# Patient Record
Sex: Male | Born: 1980 | Race: White | Hispanic: No | Marital: Married | Smoking: Current every day smoker
Health system: Southern US, Community
[De-identification: ages and names within clinical notes are randomized; demographics above are authoritative.]

## PROBLEM LIST (undated history)

## (undated) DIAGNOSIS — M069 Rheumatoid arthritis, unspecified: Secondary | ICD-10-CM

## (undated) HISTORY — DX: Rheumatoid arthritis, unspecified: M06.9

---

## 2000-08-02 ENCOUNTER — Emergency Department (HOSPITAL_COMMUNITY): Admission: EM | Admit: 2000-08-02 | Discharge: 2000-08-02 | Payer: Self-pay | Admitting: Emergency Medicine

## 2001-06-05 ENCOUNTER — Emergency Department (HOSPITAL_COMMUNITY): Admission: EM | Admit: 2001-06-05 | Discharge: 2001-06-05 | Payer: Self-pay | Admitting: Emergency Medicine

## 2004-07-25 ENCOUNTER — Emergency Department: Payer: Self-pay | Admitting: Emergency Medicine

## 2009-12-24 ENCOUNTER — Emergency Department: Payer: Self-pay | Admitting: Emergency Medicine

## 2010-01-22 ENCOUNTER — Emergency Department: Payer: Self-pay | Admitting: Emergency Medicine

## 2011-09-16 ENCOUNTER — Ambulatory Visit: Payer: Self-pay | Admitting: Family Medicine

## 2019-05-30 ENCOUNTER — Emergency Department: Payer: PRIVATE HEALTH INSURANCE

## 2019-05-30 ENCOUNTER — Inpatient Hospital Stay
Admission: EM | Admit: 2019-05-30 | Discharge: 2019-06-02 | DRG: 419 | Disposition: A | Discharge status: Home / Self Care | Payer: PRIVATE HEALTH INSURANCE | Attending: Surgery | Admitting: Surgery

## 2019-05-30 ENCOUNTER — Other Ambulatory Visit: Payer: Self-pay

## 2019-05-30 DIAGNOSIS — K819 Cholecystitis, unspecified: Secondary | ICD-10-CM | POA: Diagnosis not present

## 2019-05-30 DIAGNOSIS — K8001 Calculus of gallbladder with acute cholecystitis with obstruction: Secondary | ICD-10-CM | POA: Diagnosis not present

## 2019-05-30 DIAGNOSIS — R1011 Right upper quadrant pain: Secondary | ICD-10-CM

## 2019-05-30 DIAGNOSIS — K8000 Calculus of gallbladder with acute cholecystitis without obstruction: Secondary | ICD-10-CM | POA: Diagnosis present

## 2019-05-30 DIAGNOSIS — K429 Umbilical hernia without obstruction or gangrene: Secondary | ICD-10-CM

## 2019-05-30 DIAGNOSIS — R0902 Hypoxemia: Secondary | ICD-10-CM | POA: Diagnosis not present

## 2019-05-30 DIAGNOSIS — Z20822 Contact with and (suspected) exposure to covid-19: Secondary | ICD-10-CM | POA: Diagnosis present

## 2019-05-30 DIAGNOSIS — E876 Hypokalemia: Secondary | ICD-10-CM | POA: Diagnosis not present

## 2019-05-30 LAB — CBC
HCT: 38.4 % — ABNORMAL LOW (ref 39.0–52.0)
Hemoglobin: 13.2 g/dL (ref 13.0–17.0)
MCH: 27.5 pg (ref 26.0–34.0)
MCHC: 34.4 g/dL (ref 30.0–36.0)
MCV: 80 fL (ref 80.0–100.0)
Platelets: 203 10*3/uL (ref 150–400)
RBC: 4.8 MIL/uL (ref 4.22–5.81)
RDW: 13.1 % (ref 11.5–15.5)
WBC: 9 10*3/uL (ref 4.0–10.5)
nRBC: 0 % (ref 0.0–0.2)

## 2019-05-30 LAB — BASIC METABOLIC PANEL
Anion gap: 10 (ref 5–15)
BUN: 12 mg/dL (ref 6–20)
CO2: 28 mmol/L (ref 22–32)
Calcium: 9.6 mg/dL (ref 8.9–10.3)
Chloride: 101 mmol/L (ref 98–111)
Creatinine, Ser: 1.07 mg/dL (ref 0.61–1.24)
GFR calc Af Amer: >60 mL/min (ref >60)
GFR calc non Af Amer: >60 mL/min (ref >60)
Glucose, Bld: 120 mg/dL — ABNORMAL HIGH (ref 70–99)
Potassium: 3.9 mmol/L (ref 3.5–5.1)
Sodium: 139 mmol/L (ref 135–145)

## 2019-05-30 LAB — HEPATIC FUNCTION PANEL
ALT: 80 U/L — ABNORMAL HIGH (ref 0–44)
AST: 60 U/L — ABNORMAL HIGH (ref 15–41)
Albumin: 4.4 g/dL (ref 3.5–5.0)
Alkaline Phosphatase: 70 U/L (ref 38–126)
Bilirubin, Direct: <0.1 mg/dL (ref 0.0–0.2)
Total Bilirubin: 0.8 mg/dL (ref 0.3–1.2)
Total Protein: 7.7 g/dL (ref 6.5–8.1)

## 2019-05-30 LAB — SARS CORONAVIRUS 2 BY RT PCR (HOSPITAL ORDER, PERFORMED IN ~~LOC~~ HOSPITAL LAB): SARS Coronavirus 2: NEGATIVE

## 2019-05-30 LAB — TROPONIN I (HIGH SENSITIVITY): Troponin I (High Sensitivity): 3 ng/L (ref <18)

## 2019-05-30 LAB — LIPASE, BLOOD: Lipase: 21 U/L (ref 11–51)

## 2019-05-30 MED ORDER — HYDROMORPHONE HCL 1 MG/ML IJ SOLN
INTRAMUSCULAR | Status: AC
  Administered 2019-05-30: 0.5 mg via INTRAVENOUS
  Filled 2019-05-30: qty 1

## 2019-05-30 MED ORDER — DEXTROSE IN LACTATED RINGERS 5 % IV SOLN: INTRAVENOUS | Status: DC

## 2019-05-30 MED ORDER — HYDROMORPHONE HCL 1 MG/ML IJ SOLN
0.5000 mg | Freq: Once | INTRAMUSCULAR | Status: AC
  Administered 2019-05-30: 0.5 mg via INTRAVENOUS
  Filled 2019-05-30: qty 1

## 2019-05-30 MED ORDER — HYDROMORPHONE HCL 1 MG/ML IJ SOLN
0.5000 mg | INTRAMUSCULAR | Status: DC | PRN
  Administered 2019-06-01: 0.5 mg via INTRAVENOUS
  Filled 2019-05-30: qty 1

## 2019-05-30 MED ORDER — IOHEXOL 300 MG/ML  SOLN
125.0000 mL | Freq: Once | INTRAMUSCULAR | Status: AC | PRN
  Administered 2019-05-30: 125 mL via INTRAVENOUS
  Filled 2019-05-30: qty 125

## 2019-05-30 MED ORDER — PIPERACILLIN-TAZOBACTAM 3.375 G IVPB
3.3750 g | Freq: Three times a day (TID) | INTRAVENOUS | Status: DC
  Administered 2019-05-31 – 2019-06-02 (×8): 3.375 g via INTRAVENOUS
  Filled 2019-05-30 (×10): qty 50

## 2019-05-30 MED ORDER — ONDANSETRON HCL 4 MG/2ML IJ SOLN
4.0000 mg | Freq: Four times a day (QID) | INTRAMUSCULAR | Status: DC | PRN
  Administered 2019-05-30 (×2): 4 mg via INTRAVENOUS
  Filled 2019-05-30: qty 2

## 2019-05-30 MED ORDER — PIPERACILLIN-TAZOBACTAM 3.375 G IVPB 30 MIN
3.3750 g | Freq: Once | INTRAVENOUS | Status: AC
  Administered 2019-05-30: 3.375 g via INTRAVENOUS
  Filled 2019-05-30: qty 50

## 2019-05-30 MED ORDER — KETOROLAC TROMETHAMINE 30 MG/ML IJ SOLN
INTRAMUSCULAR | Status: AC
  Administered 2019-05-30: 30 mg via INTRAVENOUS
  Filled 2019-05-30: qty 1

## 2019-05-30 MED ORDER — MORPHINE SULFATE (PF) 4 MG/ML IV SOLN
4.0000 mg | Freq: Once | INTRAVENOUS | Status: AC
  Administered 2019-05-30: 4 mg via INTRAVENOUS
  Filled 2019-05-30: qty 1

## 2019-05-30 MED ORDER — ACETAMINOPHEN 500 MG PO TABS
ORAL_TABLET | ORAL | Status: AC
  Filled 2019-05-30: qty 2

## 2019-05-30 MED ORDER — ONDANSETRON HCL 4 MG/2ML IJ SOLN
4.0000 mg | Freq: Once | INTRAMUSCULAR | Status: AC
  Administered 2019-05-30: 4 mg via INTRAVENOUS
  Filled 2019-05-30: qty 2

## 2019-05-30 MED ORDER — SODIUM CHLORIDE 0.9% FLUSH: 3.0000 mL | Freq: Once | INTRAVENOUS | Status: DC

## 2019-05-30 MED ORDER — LACTATED RINGERS IV BOLUS
1000.0000 mL | Freq: Once | INTRAVENOUS | Status: AC
  Administered 2019-05-30: 1000 mL via INTRAVENOUS

## 2019-05-30 MED ORDER — ONDANSETRON 4 MG PO TBDP
4.0000 mg | ORAL_TABLET | Freq: Four times a day (QID) | ORAL | Status: DC | PRN
  Filled 2019-05-30: qty 1

## 2019-05-30 MED ORDER — SIMETHICONE 80 MG PO CHEW
40.0000 mg | CHEWABLE_TABLET | Freq: Four times a day (QID) | ORAL | Status: DC | PRN
  Filled 2019-05-30: qty 1

## 2019-05-30 MED ORDER — KETOROLAC TROMETHAMINE 30 MG/ML IJ SOLN
30.0000 mg | Freq: Four times a day (QID) | INTRAMUSCULAR | Status: DC | PRN
  Administered 2019-05-31 – 2019-06-02 (×2): 30 mg via INTRAVENOUS
  Filled 2019-05-30 (×2): qty 1

## 2019-05-30 MED ORDER — ACETAMINOPHEN 500 MG PO TABS
1000.0000 mg | ORAL_TABLET | Freq: Four times a day (QID) | ORAL | Status: DC
  Administered 2019-05-30 – 2019-06-02 (×7): 1000 mg via ORAL
  Filled 2019-05-30 (×7): qty 2

## 2019-05-30 NOTE — ED Notes (Signed)
See triage note-  Pt states epigastric pain started this morning, took pepcid and tums without relief, pain has since radiated down right and left abd that is aching and sharp, nausea without vomiting.  Pt appears uncomfortable in treatment room.

## 2019-05-30 NOTE — ED Notes (Signed)
Pt rolling around the bed reporting the pain is unbearable at this time. Pt unable to get comfortable and reports nausea has returned as well.

## 2019-05-30 NOTE — H&P (Signed)
Reason for Consult: Acute cholecystitis Referring Physician: Blake Divine, MD (emergency medicine)  Raymond Marsh is an 39 y.o. male.  HPI: He presented to the emergency department today with epigastric pain that began at about 4 AM.  He says that he thought it was heartburn and took some Pepcid.  He then went back to sleep but when he awoke, he was still uncomfortable.  He went to work, but became increasingly uncomfortable and presented to the emergency department.  His pain is predominantly in the epigastrium and right upper quadrant, with some radiation to the right lower quadrant.  He has had nausea without vomiting.  He has never had a similar episode.  Evaluation in the emergency department included a right upper quadrant ultrasound that was consistent with acute calculus cholecystitis.  There was no concern for choledocholithiasis and his white blood cell count is actually  normal.  General surgery has been consulted for further evaluation and management.  He has never had abdominal surgery.  History reviewed. No pertinent past medical history.  History reviewed. No pertinent surgical history.  History reviewed. No pertinent family history.  Social History:  has no history on file for tobacco, alcohol, and drug.  Allergies: Not on File  Medications: I have reviewed the patient's current medications, which are none  Results for orders placed or performed during the hospital encounter of 05/30/19 (from the past 48 hour(s))  Basic metabolic panel     Status: Abnormal   Collection Time: 05/30/19 11:40 AM  Result Value Ref Range   Sodium 139 135 - 145 mmol/L   Potassium 3.9 3.5 - 5.1 mmol/L   Chloride 101 98 - 111 mmol/L   CO2 28 22 - 32 mmol/L   Glucose, Bld 120 (H) 70 - 99 mg/dL    Comment: Glucose reference range applies only to samples taken after fasting for at least 8 hours.   BUN 12 6 - 20 mg/dL   Creatinine, Ser 1.07 0.61 - 1.24 mg/dL   Calcium 9.6 8.9 - 10.3 mg/dL   GFR  calc non Af Amer >60 >60 mL/min   GFR calc Af Amer >60 >60 mL/min   Anion gap 10 5 - 15    Comment: Performed at [facility], [address] [place], [place] [address]  CBC     Status: Abnormal   Collection Time: 05/30/19 11:40 AM  Result Value Ref Range   WBC 9.0 4.0 - 10.5 K/uL   RBC 4.80 4.22 - 5.81 MIL/uL   Hemoglobin 13.2 13.0 - 17.0 g/dL   HCT 38.4 (L) 39.0 - 52.0 %   MCV 80.0 80.0 - 100.0 fL   MCH 27.5 26.0 - 34.0 pg   MCHC 34.4 30.0 - 36.0 g/dL   RDW 13.1 11.5 - 15.5 %   Platelets 203 150 - 400 K/uL   nRBC 0.0 0.0 - 0.2 %    Comment: Performed at [facility], [place],  [address]  Troponin I (High Sensitivity)     Status: None   Collection Time: 05/30/19 11:40 AM  Result Value Ref Range   Troponin I (High Sensitivity) 3 <18 ng/L    Comment: (NOTE) Elevated high sensitivity troponin I (hsTnI) values and significant  changes across serial measurements may suggest ACS but many other  chronic and acute conditions are known to elevate hsTnI results.  Refer to the "Links" section for chest pain algorithms and additional  guidance. Performed at [facility], [address] [place], [place] [address]   Hepatic function panel     Status: Abnormal   Collection Time: 05/30/19 11:40 AM  Result Value Ref Range   Total Protein 7.7 6.5 - 8.1 g/dL   Albumin 4.4 3.5 - 5.0 g/dL   AST 60 (H) 15 - 41 U/L   ALT 80 (H) 0 - 44 U/L   Alkaline Phosphatase 70 38 - 126 U/L   Total Bilirubin 0.8 0.3 - 1.2 mg/dL   Bilirubin, Direct <7.4 0.0 - 0.2 mg/dL   Indirect Bilirubin NOT CALCULATED 0.3 - 0.9 mg/dL    Comment: Performed at [facility], [address] [place], [place] [address]  Lipase, blood     Status: None   Collection Time: 05/30/19 11:40 AM  Result Value Ref Range   Lipase 21 11 - 51 U/L    Comment: Performed at [facility], [address], [place], [place] [address]    DG Chest 2 View  Result Date:  05/30/2019 CLINICAL DATA:  Chest pain. EXAM: CHEST - 2 VIEW COMPARISON:  September 16, 2011. FINDINGS: The heart size and mediastinal contours are within normal limits. Both lungs are clear. No pneumothorax or pleural effusion is noted. The visualized skeletal structures are unremarkable. IMPRESSION: No active cardiopulmonary disease. Electronically Signed   By: Lupita Raider M.D.   On: 05/30/2019 12:41   CT Abdomen Pelvis W Contrast  Result Date: 05/30/2019 CLINICAL DATA:  Epigastric pain beginning this morning.  Nausea. EXAM: CT ABDOMEN AND PELVIS WITH CONTRAST TECHNIQUE: Multidetector CT imaging of the abdomen and pelvis was performed using the standard protocol following bolus administration of intravenous contrast. CONTRAST:  OMNIPAQUE IOHEXOL 300 MG/ML  SOLN COMPARISON:  None. FINDINGS: Lower chest: Lung bases are clear. Hepatobiliary: Mild diffuse low-attenuation of the liver likely representing a degree of steatosis. No focal liver mass. Gallbladder is minimally distended without wall thickening or stones visualized. Possible subtle inflammatory change adjacent the gallbladder neck. Biliary tree is normal. Pancreas: Mild fatty atrophy. Spleen: Normal. Adrenals/Urinary Tract: Adrenal glands are normal and symmetric. Kidneys are normal in size without hydronephrosis or nephrolithiasis. Ureters and bladder are normal. Stomach/Bowel: Stomach and small bowel are normal. Appendix is normal. Colon is normal. Vascular/Lymphatic: Abdominal aorta is normal in caliber. No adenopathy. Reproductive: Normal. Other: No free fluid or focal inflammatory change. Small umbilical hernia containing only peritoneal fat. Musculoskeletal: No acute findings. IMPRESSION: 1.  No acute findings in the abdomen/pelvis. 2. Minimal nonspecific prominence of the gallbladder with possible subtle inflammatory change adjacent the gallbladder neck. Consider right upper quadrant ultrasound for further evaluation. 3.  Possible mild  degree of hepatic steatosis without focal mass. 4.  Small umbilical hernia containing only peritoneal fat. Electronically Signed   By: Elberta Fortis M.D.   On: 05/30/2019 15:57   US Abdomen Limited RUQ  Result Date: 05/30/2019 CLINICAL DATA:  Right upper quadrant pain and nausea. EXAM: ULTRASOUND ABDOMEN LIMITED RIGHT UPPER QUADRANT COMPARISON:  None. FINDINGS: Gallbladder: Ill-defined mobile echogenic renal stones are seen (largest measures 6.2 mm). Mild gallbladder wall thickening is seen (3.0 mm). A positive sonographic Eulah Pont sign is noted by sonographer. Common bile duct: Diameter: 5.3 mm Liver: No focal lesion identified. Diffusely increased echogenicity of the liver parenchyma is noted. Portal vein is patent on color Doppler imaging with normal direction of blood flow towards the liver. Other: None. IMPRESSION: 1. Cholelithiasis with additional findings consistent with acute cholecystitis. 2. Fatty liver. Electronically Signed   By: Waylan Rocher  Houston M.D.   On: 05/30/2019 17:01    Review of Systems  Gastrointestinal: Positive for abdominal pain and nausea.   Blood pressure (!) 158/84, pulse 94, temperature 98.7 F (37.1 C), temperature source Oral, resp. rate 20, height 6\' 2"  (1.88 m), weight 111.1 kg, SpO2 94 %.  Body mass index is 31.46 kg/m.  Physical Exam  Constitutional: He is oriented to person, place, and time. He appears well-developed and well-nourished.  He is clearly uncomfortable.  HENT:  Head: Normocephalic and atraumatic.  Eyes: Right eye exhibits no discharge. Left eye exhibits no discharge. No scleral icterus.  Neck: No tracheal deviation present.  Cardiovascular: Normal rate and regular rhythm.  Respiratory: Effort normal. No stridor. No respiratory distress.  GI: Soft. There is abdominal tenderness.  Primarily tender to deep palpation in the epigastrium and right upper quadrant, although he does have mild tenderness in the right lower quadrant as well.   Genitourinary:    Genitourinary Comments: Deferred   Musculoskeletal:        General: No deformity or edema.  Neurological: He is alert and oriented to person, place, and time.  Skin: Skin is warm and dry.  Psychiatric: He has a normal mood and affect. His behavior is normal.    Assessment/Plan: This is a 39 year old man with acute onset of epigastric and right upper quadrant pain beginning at 4 AM.  Evaluation in the emergency department is most consistent with acute cholecystitis.  We will admit him and give him IV fluids and IV antibiotics.  He may have clear liquids tonight and will be made n.p.o. after midnight in anticipation of cholecystectomy tomorrow, pending OR and anesthesia availability. I discussed the procedure in detail.  We discussed the risks and benefits of a laparoscopic cholecystectomy and possible cholangiogram including, but not limited to: bleeding, infection, injury to surrounding structures such as the intestine or liver, bile leak, retained gallstones, need to convert to an open procedure, prolonged diarrhea, blood clots such as DVT, common bile duct injury, anesthesia risks, and possible need for additional procedures. The patient had the opportunity to ask any questions and these were answered to his satisfaction. 20 05/30/2019, 6:59 PM

## 2019-05-30 NOTE — ED Provider Notes (Signed)
[facility] Emergency Department Provider Note   ____________________________________________   First MD Initiated Contact with Patient 05/30/19 1441     (approximate)  I have reviewed the triage vital signs and the nursing notes.   HISTORY  Chief Complaint Chest Pain    HPI Raymond Marsh is a 39 y.o. male with no significant past medical history who presents to the ED complaining of chest and abdominal pain.  Patient reports he initially developed pain in his epigastrium around 4 AM this morning which has gradually worsened since then.  He took Pepcid and Tums without any relief and pain has now seem to move into his abdomen diffusely.  He also states that it radiates to his mid back on both sides, but currently denies any pain in his chest or difficulty breathing.  He has not had any fevers, chills, or cough recently.  He denies any dysuria, hematuria, or changes in bowel movements.  He denies any prior abdominal surgeries and has never had similar pain in the past.        History reviewed. No pertinent past medical history.  Patient Active Problem List   Diagnosis Date Noted  . Acute calculous cholecystitis 05/30/2019    History reviewed. No pertinent surgical history.  Prior to Admission medications   Not on File    Allergies Patient has no allergy information on record.  History reviewed. No pertinent family history.  Social History Social History   Tobacco Use  . Smoking status: Not on file  Substance Use Topics  . Alcohol use: Not on file  . Drug use: Not on file    Review of Systems  Constitutional: No fever/chills Eyes: No visual changes. ENT: No sore throat. Cardiovascular: Denies chest pain. Respiratory: Denies shortness of breath. Gastrointestinal: Positive for abdominal pain.  Positive for nausea, no vomiting.  No diarrhea.  No constipation. Genitourinary: Negative for dysuria. Musculoskeletal: Negative for back  pain. Skin: Negative for rash. Neurological: Negative for headaches, focal weakness or numbness.  ____________________________________________   PHYSICAL EXAM:  VITAL SIGNS: ED Triage Vitals  Enc Vitals Group     BP 05/30/19 1139 (!) 151/83     Pulse Rate 05/30/19 1139 98     Resp 05/30/19 1139 18     Temp 05/30/19 1139 98.7 F (37.1 C)     Temp Source 05/30/19 1139 Oral     SpO2 05/30/19 1139 99 %     Weight 05/30/19 1139 245 lb (111.1 kg)     Height 05/30/19 1139 6\' 2"  (1.88 m)     Head Circumference --      Peak Flow --      Pain Score 05/30/19 1152 8     Pain Loc --      Pain Edu? --      Excl. in GC? --     Constitutional: Alert and oriented. Eyes: Conjunctivae are normal. Head: Atraumatic. Nose: No congestion/rhinnorhea. Mouth/Throat: Mucous membranes are moist. Neck: Normal ROM Cardiovascular: Normal rate, regular rhythm. Grossly normal heart sounds. Respiratory: Normal respiratory effort.  No retractions. Lungs CTAB. Gastrointestinal: Soft and tender to palpation in the right upper quadrant and right lower quadrant with voluntary guarding. No distention. Genitourinary: deferred Musculoskeletal: No lower extremity tenderness nor edema. Neurologic:  Normal speech and language. No gross focal neurologic deficits are appreciated. Skin:  Skin is warm, dry and intact. No rash noted. Psychiatric: Mood and affect are normal. Speech and behavior are normal.  ____________________________________________  LABS (all labs ordered are listed, but only abnormal results are displayed)  Labs Reviewed  BASIC METABOLIC PANEL - Abnormal; Notable for the following components:      Result Value   Glucose, Bld 120 (*)    All other components within normal limits  CBC - Abnormal; Notable for the following components:   HCT 38.4 (*)    All other components within normal limits  HEPATIC FUNCTION PANEL - Abnormal; Notable for the following components:   AST 60 (*)    ALT 80  (*)    All other components within normal limits  SARS CORONAVIRUS 2 BY RT PCR (HOSPITAL ORDER, [facility])  LIPASE, BLOOD  HIV ANTIBODY (ROUTINE TESTING W REFLEX)  CBC  BASIC METABOLIC PANEL  TROPONIN I (HIGH SENSITIVITY)  TROPONIN I (HIGH SENSITIVITY)   ____________________________________________  EKG  ED ECG REPORT I, Blake Divine, the attending physician, personally viewed and interpreted this ECG.   Date: 05/30/2019  EKG Time: 11:33  Rate: 89  Rhythm: normal sinus rhythm  Axis: Normal  Intervals:none  ST&T Change: None   PROCEDURES  Procedure(s) performed (including Critical Care):  Procedures   ____________________________________________   INITIAL IMPRESSION / ASSESSMENT AND PLAN / ED COURSE       39 year old male presents to the ED with acute onset of pain in his epigastrium around 4 AM this morning which has gradually worsened and spread into his abdomen more diffusely.  Initial work-up for chest pain is unremarkable, EKG without acute ischemic changes and troponin is negative.  Chest x-ray is also negative for acute process.  At this point, I would favor intra-abdominal pathology rather than a cardiac or pulmonary issue.  We will add on LFTs and lipase, further assess with CT scan to check for cholecystitis, appendicitis, or other pathology.  Plan to treat pain with IV morphine and Zofran, hydrate with IV fluids.  Dissection seems unlikely given his young age and lack of risk factors, pulses are intact in all 4 extremities.  CT abdomen/pelvis shows subtle inflammatory changes near the neck of the gallbladder bed is otherwise negative for acute process.  Patient continues to have severe right upper quadrant pain, we will medicate with IV Dilaudid.  Right upper quadrant ultrasound was performed and is consistent with cholecystitis.  General surgery was consulted and will plan for admission.       ____________________________________________   FINAL CLINICAL IMPRESSION(S) / ED DIAGNOSES  Final diagnoses:  RUQ pain     ED Discharge Orders    None       Note:  This document was prepared using Dragon voice recognition software and may include unintentional dictation errors.   Blake Divine, MD 05/30/19 1859

## 2019-05-30 NOTE — ED Triage Notes (Addendum)
Pt arrives via POV for c/o chest pain that started at 4am. Pain is in upper abdomen/chest area and pt reports pain radiates to both sides of his back under neath his arms. Pt took pepcid but did not get any relief. Pt appears uncomfortable in triage. Denies SHOB. Pt reports he has an umbilical hernia and has had it "for a long time".

## 2019-05-31 ENCOUNTER — Observation Stay: Payer: PRIVATE HEALTH INSURANCE | Admitting: Anesthesiology

## 2019-05-31 ENCOUNTER — Encounter
Admission: EM | Disposition: A | Discharge status: Home / Self Care | Payer: Self-pay | Source: Home / Self Care | Attending: General Surgery

## 2019-05-31 ENCOUNTER — Other Ambulatory Visit: Payer: Self-pay

## 2019-05-31 ENCOUNTER — Encounter: Payer: Self-pay | Admitting: General Surgery

## 2019-05-31 DIAGNOSIS — R0902 Hypoxemia: Secondary | ICD-10-CM | POA: Diagnosis not present

## 2019-05-31 DIAGNOSIS — K8001 Calculus of gallbladder with acute cholecystitis with obstruction: Secondary | ICD-10-CM | POA: Diagnosis present

## 2019-05-31 DIAGNOSIS — Z20822 Contact with and (suspected) exposure to covid-19: Secondary | ICD-10-CM | POA: Diagnosis present

## 2019-05-31 DIAGNOSIS — K819 Cholecystitis, unspecified: Secondary | ICD-10-CM | POA: Diagnosis present

## 2019-05-31 DIAGNOSIS — K8000 Calculus of gallbladder with acute cholecystitis without obstruction: Secondary | ICD-10-CM | POA: Diagnosis not present

## 2019-05-31 DIAGNOSIS — K429 Umbilical hernia without obstruction or gangrene: Secondary | ICD-10-CM | POA: Diagnosis present

## 2019-05-31 DIAGNOSIS — E876 Hypokalemia: Secondary | ICD-10-CM | POA: Diagnosis not present

## 2019-05-31 HISTORY — PX: UMBILICAL HERNIA REPAIR: SHX196

## 2019-05-31 HISTORY — PX: CHOLECYSTECTOMY: SHX55

## 2019-05-31 LAB — CBC
HCT: 37.1 % — ABNORMAL LOW (ref 39.0–52.0)
Hemoglobin: 13.2 g/dL (ref 13.0–17.0)
MCH: 27.8 pg (ref 26.0–34.0)
MCHC: 35.6 g/dL (ref 30.0–36.0)
MCV: 78.1 fL — ABNORMAL LOW (ref 80.0–100.0)
Platelets: 186 10*3/uL (ref 150–400)
RBC: 4.75 MIL/uL (ref 4.22–5.81)
RDW: 13.2 % (ref 11.5–15.5)
WBC: 12.4 10*3/uL — ABNORMAL HIGH (ref 4.0–10.5)
nRBC: 0 % (ref 0.0–0.2)

## 2019-05-31 LAB — BASIC METABOLIC PANEL
Anion gap: 9 (ref 5–15)
BUN: 14 mg/dL (ref 6–20)
CO2: 28 mmol/L (ref 22–32)
Calcium: 9.1 mg/dL (ref 8.9–10.3)
Chloride: 100 mmol/L (ref 98–111)
Creatinine, Ser: 1.17 mg/dL (ref 0.61–1.24)
GFR calc Af Amer: >60 mL/min (ref >60)
GFR calc non Af Amer: >60 mL/min (ref >60)
Glucose, Bld: 139 mg/dL — ABNORMAL HIGH (ref 70–99)
Potassium: 3.4 mmol/L — ABNORMAL LOW (ref 3.5–5.1)
Sodium: 137 mmol/L (ref 135–145)

## 2019-05-31 LAB — HIV ANTIBODY (ROUTINE TESTING W REFLEX): HIV Screen 4th Generation wRfx: NONREACTIVE

## 2019-05-31 SURGERY — LAPAROSCOPIC CHOLECYSTECTOMY
Anesthesia: General

## 2019-05-31 MED ORDER — GLYCOPYRROLATE 0.2 MG/ML IJ SOLN
INTRAMUSCULAR | Status: AC
  Filled 2019-05-31: qty 1

## 2019-05-31 MED ORDER — VISTASEAL 10 ML SINGLE DOSE KIT
PACK | CUTANEOUS | Status: DC | PRN
  Administered 2019-05-31: 10 mL via TOPICAL

## 2019-05-31 MED ORDER — MIDAZOLAM HCL 2 MG/2ML IJ SOLN
INTRAMUSCULAR | Status: AC
  Filled 2019-05-31: qty 2

## 2019-05-31 MED ORDER — SEVOFLURANE IN SOLN
RESPIRATORY_TRACT | Status: AC
  Filled 2019-05-31: qty 250

## 2019-05-31 MED ORDER — ACETAMINOPHEN 325 MG PO TABS: 325.0000 mg | ORAL_TABLET | ORAL | Status: DC | PRN

## 2019-05-31 MED ORDER — DEXAMETHASONE SODIUM PHOSPHATE 10 MG/ML IJ SOLN
INTRAMUSCULAR | Status: AC
  Filled 2019-05-31: qty 1

## 2019-05-31 MED ORDER — PROPOFOL 10 MG/ML IV BOLUS
INTRAVENOUS | Status: AC
  Filled 2019-05-31: qty 20

## 2019-05-31 MED ORDER — KCL-LACTATED RINGERS-D5W 20 MEQ/L IV SOLN
INTRAVENOUS | Status: DC
  Filled 2019-05-31 (×6): qty 1000

## 2019-05-31 MED ORDER — LIDOCAINE-EPINEPHRINE 1 %-1:100000 IJ SOLN
INTRAMUSCULAR | Status: AC
  Filled 2019-05-31: qty 1

## 2019-05-31 MED ORDER — LACTATED RINGERS IV SOLN: INTRAVENOUS | Status: DC

## 2019-05-31 MED ORDER — PHENYLEPHRINE HCL (PRESSORS) 10 MG/ML IV SOLN
INTRAVENOUS | Status: DC | PRN
  Administered 2019-05-31: 100 ug via INTRAVENOUS
  Administered 2019-05-31 (×4): 200 ug via INTRAVENOUS
  Administered 2019-05-31 (×2): 100 ug via INTRAVENOUS
  Administered 2019-05-31: 200 ug via INTRAVENOUS

## 2019-05-31 MED ORDER — ACETAMINOPHEN 10 MG/ML IV SOLN
INTRAVENOUS | Status: DC | PRN
  Administered 2019-05-31: 1000 mg via INTRAVENOUS

## 2019-05-31 MED ORDER — FENTANYL CITRATE (PF) 100 MCG/2ML IJ SOLN
INTRAMUSCULAR | Status: AC
  Filled 2019-05-31: qty 2

## 2019-05-31 MED ORDER — PIPERACILLIN-TAZOBACTAM 3.375 G IVPB
INTRAVENOUS | Status: AC
  Filled 2019-05-31: qty 50

## 2019-05-31 MED ORDER — ONDANSETRON HCL 4 MG/2ML IJ SOLN
INTRAMUSCULAR | Status: AC
  Filled 2019-05-31: qty 2

## 2019-05-31 MED ORDER — LIDOCAINE HCL (CARDIAC) PF 100 MG/5ML IV SOSY
PREFILLED_SYRINGE | INTRAVENOUS | Status: DC | PRN
  Administered 2019-05-31: 100 mg via INTRAVENOUS

## 2019-05-31 MED ORDER — VISTASEAL 10 ML SINGLE DOSE KIT
PACK | CUTANEOUS | Status: AC
  Filled 2019-05-31: qty 10

## 2019-05-31 MED ORDER — PROPOFOL 10 MG/ML IV BOLUS
INTRAVENOUS | Status: DC | PRN
  Administered 2019-05-31: 250 mg via INTRAVENOUS

## 2019-05-31 MED ORDER — DEXAMETHASONE SODIUM PHOSPHATE 10 MG/ML IJ SOLN
INTRAMUSCULAR | Status: DC | PRN
  Administered 2019-05-31: 10 mg via INTRAVENOUS

## 2019-05-31 MED ORDER — SUGAMMADEX SODIUM 500 MG/5ML IV SOLN
INTRAVENOUS | Status: AC
  Filled 2019-05-31: qty 5

## 2019-05-31 MED ORDER — FENTANYL CITRATE (PF) 100 MCG/2ML IJ SOLN
25.0000 ug | INTRAMUSCULAR | Status: DC | PRN
  Administered 2019-05-31 (×3): 25 ug via INTRAVENOUS

## 2019-05-31 MED ORDER — SUGAMMADEX SODIUM 200 MG/2ML IV SOLN
INTRAVENOUS | Status: DC | PRN
  Administered 2019-05-31: 500 mg via INTRAVENOUS

## 2019-05-31 MED ORDER — FENTANYL CITRATE (PF) 100 MCG/2ML IJ SOLN
INTRAMUSCULAR | Status: DC | PRN
  Administered 2019-05-31: 100 ug via INTRAVENOUS
  Administered 2019-05-31: 25 ug via INTRAVENOUS

## 2019-05-31 MED ORDER — HYDROCODONE-ACETAMINOPHEN 7.5-325 MG PO TABS: 1.0000 | ORAL_TABLET | Freq: Once | ORAL | Status: DC | PRN

## 2019-05-31 MED ORDER — PROMETHAZINE HCL 25 MG/ML IJ SOLN: 6.2500 mg | INTRAMUSCULAR | Status: DC | PRN

## 2019-05-31 MED ORDER — OXYCODONE HCL 5 MG PO TABS
5.0000 mg | ORAL_TABLET | ORAL | Status: DC | PRN
  Administered 2019-05-31 – 2019-06-02 (×3): 5 mg via ORAL
  Filled 2019-05-31 (×3): qty 1

## 2019-05-31 MED ORDER — METHADONE HCL 10 MG/ML PO CONC
110.0000 mg | Freq: Every day | ORAL | Status: DC
  Administered 2019-05-31 – 2019-06-02 (×3): 110 mg via ORAL
  Filled 2019-05-31 (×2): qty 11

## 2019-05-31 MED ORDER — MEPERIDINE HCL 50 MG/ML IJ SOLN: 6.2500 mg | INTRAMUSCULAR | Status: DC | PRN

## 2019-05-31 MED ORDER — GLYCOPYRROLATE 0.2 MG/ML IJ SOLN
INTRAMUSCULAR | Status: DC | PRN
  Administered 2019-05-31: .2 mg via INTRAVENOUS

## 2019-05-31 MED ORDER — ACETAMINOPHEN 160 MG/5ML PO SOLN
325.0000 mg | ORAL | Status: DC | PRN
  Filled 2019-05-31: qty 20.3

## 2019-05-31 MED ORDER — ROCURONIUM BROMIDE 10 MG/ML (PF) SYRINGE
PREFILLED_SYRINGE | INTRAVENOUS | Status: AC
  Filled 2019-05-31: qty 10

## 2019-05-31 MED ORDER — BUPIVACAINE HCL (PF) 0.25 % IJ SOLN
INTRAMUSCULAR | Status: AC
  Filled 2019-05-31: qty 30

## 2019-05-31 MED ORDER — KETOROLAC TROMETHAMINE 30 MG/ML IJ SOLN: 30.0000 mg | Freq: Once | INTRAMUSCULAR | Status: DC | PRN

## 2019-05-31 MED ORDER — FENTANYL CITRATE (PF) 100 MCG/2ML IJ SOLN
INTRAMUSCULAR | Status: AC
  Administered 2019-05-31: 25 ug via INTRAVENOUS
  Filled 2019-05-31: qty 2

## 2019-05-31 MED ORDER — PROPOFOL 10 MG/ML IV BOLUS
INTRAVENOUS | Status: AC
  Filled 2019-05-31: qty 40

## 2019-05-31 MED ORDER — ACETAMINOPHEN 10 MG/ML IV SOLN
INTRAVENOUS | Status: AC
  Filled 2019-05-31: qty 100

## 2019-05-31 MED ORDER — KETOROLAC TROMETHAMINE 30 MG/ML IJ SOLN
INTRAMUSCULAR | Status: AC
  Filled 2019-05-31: qty 1

## 2019-05-31 MED ORDER — ROCURONIUM BROMIDE 100 MG/10ML IV SOLN
INTRAVENOUS | Status: DC | PRN
  Administered 2019-05-31: 40 mg via INTRAVENOUS
  Administered 2019-05-31: 50 mg via INTRAVENOUS
  Administered 2019-05-31: 40 mg via INTRAVENOUS
  Administered 2019-05-31: 20 mg via INTRAVENOUS

## 2019-05-31 MED ORDER — MIDAZOLAM HCL 2 MG/2ML IJ SOLN
INTRAMUSCULAR | Status: DC | PRN
  Administered 2019-05-31: 2 mg via INTRAVENOUS

## 2019-05-31 MED ORDER — LIDOCAINE-EPINEPHRINE 1 %-1:100000 IJ SOLN
INTRAMUSCULAR | Status: DC | PRN
  Administered 2019-05-31: 17 mL via INTRAMUSCULAR

## 2019-05-31 MED ORDER — FENTANYL CITRATE (PF) 100 MCG/2ML IJ SOLN
INTRAMUSCULAR | Status: AC
  Administered 2019-05-31: 19:00:00 50 ug via INTRAVENOUS
  Filled 2019-05-31: qty 2

## 2019-05-31 SURGICAL SUPPLY — 51 items
APPLICATOR VISTASEAL 35 (MISCELLANEOUS) ×3 IMPLANT
APPLIER CLIP 5 13 M/L LIGAMAX5 (MISCELLANEOUS) ×3
BLADE SURG SZ11 CARB STEEL (BLADE) ×3 IMPLANT
CANISTER SUCT 1200ML W/VALVE (MISCELLANEOUS) ×3 IMPLANT
CATH CHOLANGI 4FR 420404F (CATHETERS) IMPLANT
CHLORAPREP W/TINT 26 (MISCELLANEOUS) ×3 IMPLANT
CLIP APPLIE 5 13 M/L LIGAMAX5 (MISCELLANEOUS) ×2 IMPLANT
COVER WAND RF STERILE (DRAPES) ×3 IMPLANT
DECANTER SPIKE VIAL GLASS SM (MISCELLANEOUS) IMPLANT
DEFOGGER SCOPE WARMER CLEARIFY (MISCELLANEOUS) ×3 IMPLANT
DERMABOND ADVANCED (GAUZE/BANDAGES/DRESSINGS) ×1
DERMABOND ADVANCED .7 DNX12 (GAUZE/BANDAGES/DRESSINGS) ×2 IMPLANT
ELECT CAUTERY BLADE TIP 2.5 (TIP) ×3
ELECT REM PT RETURN 9FT ADLT (ELECTROSURGICAL) ×3
ELECTRODE CAUTERY BLDE TIP 2.5 (TIP) ×2 IMPLANT
ELECTRODE REM PT RTRN 9FT ADLT (ELECTROSURGICAL) ×2 IMPLANT
GLOVE BIO SURGEON STRL SZ 6.5 (GLOVE) ×3 IMPLANT
GLOVE INDICATOR 7.0 STRL GRN (GLOVE) ×3 IMPLANT
GOWN STRL REUS W/ TWL LRG LVL3 (GOWN DISPOSABLE) ×6 IMPLANT
GOWN STRL REUS W/TWL LRG LVL3 (GOWN DISPOSABLE) ×9
GRASPER SUT TROCAR 14GX15 (MISCELLANEOUS) IMPLANT
IRRIGATION STRYKERFLOW (MISCELLANEOUS) ×2 IMPLANT
IRRIGATOR STRYKERFLOW (MISCELLANEOUS) ×3
IV CATH ANGIO 12GX3 LT BLUE (NEEDLE) IMPLANT
IV NS 1000ML (IV SOLUTION) ×3
IV NS 1000ML BAXH (IV SOLUTION) ×2 IMPLANT
KIT TURNOVER KIT A (KITS) ×3 IMPLANT
KITTNER LAPARASCOPIC 5X40 (MISCELLANEOUS) ×3 IMPLANT
LABEL OR SOLS (LABEL) ×3 IMPLANT
NEEDLE HYPO 22GX1.5 SAFETY (NEEDLE) ×3 IMPLANT
NS IRRIG 500ML POUR BTL (IV SOLUTION) ×3 IMPLANT
PACK LAP CHOLECYSTECTOMY (MISCELLANEOUS) ×3 IMPLANT
PENCIL ELECTRO HAND CTR (MISCELLANEOUS) ×3 IMPLANT
POUCH SPECIMEN RETRIEVAL 10MM (ENDOMECHANICALS) ×3 IMPLANT
SCISSORS METZENBAUM CVD 33 (INSTRUMENTS) ×3 IMPLANT
SET TUBE SMOKE EVAC HIGH FLOW (TUBING) ×3 IMPLANT
SLEEVE ADV FIXATION 5X100MM (TROCAR) ×6 IMPLANT
SOLUTION ELECTROLUBE (MISCELLANEOUS) IMPLANT
SPONGE LAP 4X18 RFD (DISPOSABLE) ×3 IMPLANT
STRIP CLOSURE SKIN 1/2X4 (GAUZE/BANDAGES/DRESSINGS) ×3 IMPLANT
SUT ETHIBOND 0 MO6 C/R (SUTURE) ×3 IMPLANT
SUT MNCRL 4-0 (SUTURE) ×3
SUT MNCRL 4-0 27XMFL (SUTURE) ×2
SUT VIC AB 3-0 SH 27 (SUTURE) ×3
SUT VIC AB 3-0 SH 27X BRD (SUTURE) ×2 IMPLANT
SUT VICRYL 0 AB UR-6 (SUTURE) ×6 IMPLANT
SUTURE MNCRL 4-0 27XMF (SUTURE) ×2 IMPLANT
SYS KII FIOS ACCESS ABD 5X100 (TROCAR) ×3
SYSTEM KII FIOS ACES ABD 5X100 (TROCAR) ×2 IMPLANT
TROCAR ADV FIXATION 12X100MM (TROCAR) ×3 IMPLANT
WATER STERILE IRR 1000ML POUR (IV SOLUTION) ×3 IMPLANT

## 2019-05-31 NOTE — Op Note (Signed)
Operative Note     Laparoscopic Cholecystectomy and Umbilical Hernia Repair Without Mesh  Pre-operative Diagnosis: Acute calculus cholecystitis, umbilical hernia  Post-operative Diagnosis: Same  Procedure: Laparoscopic cholecystectomy, open umbilical hernia repair  Surgeon: Duanne Guess, MD  Anesthesia: GETA  Assistant: None   Findings: A small, fat-containing umbilical hernia, consistent with preoperative imaging.  The gallbladder itself was full of pus and small stones.  It was markedly inflamed, consistent with acute calculus cholecystitis.   Estimated Blood Loss: Less than 10 cc         Drains: None         Specimens: Gallbladder           Complications: none immediately apparent  Procedure Details  The patient was seen again in the preoperative holding area. The benefits, complications, treatment options, and expected outcomes were discussed with the patient. The risks of bleeding, infection, recurrence of symptoms, failure to resolve symptoms, bile duct damage, bile duct leak, retained common bile duct stone, bowel injury, any of which could require further surgery and/or ERCP, stent, or papillotomy were reviewed with the patient. The likelihood of improving the patient's symptoms with return to their baseline status is good.  The patient and/or family concurred with the proposed plan, giving informed consent.  The patient was taken to operating room, identified as ZONG MCQUARRIE and the procedure verified as laparoscopic cholecystectomy and open repair of umbilical hernia. A time out was performed and the above information confirmed.  Prior to the induction of general anesthesia, antibiotic prophylaxis was administered. VTE prophylaxis was in place. General endotracheal anesthesia was then administered and tolerated well. After the induction, the abdomen was prepped with Chloraprep and draped in the sterile fashion. The patient was positioned in the supine  position.  The umbilical incision was created over the hernia sac and electrocautery was used to dissect through subcutaneous tissue. The hernia was dissected free from adjacent tissue and fascia. The hernia sac was entered and the sac was excised. Care was taken to avoid any injury to the bowel. A Hasson trochar was placed through the hernia defect.  Pneumoperitoneum was then created with CO2 and tolerated well without any adverse changes in the patient's vital signs.  Three 5-mm ports were placed in the right upper quadrant all under direct vision. All skin incisions were infiltrated with a local anesthetic agent before making the incision and placing the trocars.   The patient was positioned  in reverse Trendelenburg, tilted slightly to the patient's left.  The omentum had wrapped itself around the lower edge of the liver and gallbladder.  It was swept away which resulted in a small amount of bleeding.  This was controlled with a mini laparotomy pad.  The gallbladder itself was markedly inflamed and tense.  Before the fundus could be grasped, the gallbladder was aspirated via a sharp trocar.  Clear fluid was aspirated, but once the trocar was removed, frank pus spilled out of the hole in the gallbladder wall.  The fundus was then grasped and retracted cephalad. Adhesions were lysed bluntly. The infundibulum was grasped and retracted laterally, exposing the peritoneum overlying the triangle of Calot. This was then divided and exposed in a blunt fashion.  All of the tissues were exceedingly inflamed and this process proceeded very slowly and meticulously until an extended critical view of the cystic duct and cystic artery was obtained. The cystic duct was clearly identified and bluntly dissected free. Both the cystic artery and duct were double clipped  and divided.  During the process of this dissection, the gallbladder wall poor in multiple places due to its friability.  This spilled a plethora of small stones  into the gallbladder fossa area.  The majority of these were small enough to suction away with the suction irrigator.  The larger ones were tucked away to be retrieved once the gallbladder was off of the liver.  The gallbladder was taken from the gallbladder fossa in a retrograde fashion with the electrocautery.  The Endopouch was introduced.  The gallbladder was placed within it, along with the mini laparotomy pad.  We then finished all of the remaining stones out and placed them in the bag.  The gallbladder, sponge, and stones were removed via the umbilical site.  The liver bed was irrigated and inspected. Copious saline irrigation was utilized and was repeatedly aspirated until clear.  Hemostasis was achieved with the electrocautery, followed by Vistaseal.  Inspection of the right upper quadrant was performed. No bleeding, bile duct injury or leak, or bowel injury was noted. Pneumoperitoneum was released.  We then proceeded to close the umbilical hernia fascia.  This was performed with interrupted 0 Ethibond sutures.  No mesh was used due to the frank pus encountered during the cholecystectomy.  The umbilical stalk was reattached to the fascia.  3-0 Vicryl was used to close the dermis. 4-0 subcuticular Monocryl was used to close the skin of each incision. Dermabond was applied, followed by Steri-Strips.  The patient was then extubated and brought to the recovery room in stable condition. Sponge, lap, and needle counts were correct at closure and at the conclusion of the case.               Fredirick Maudlin, MD, FACS

## 2019-05-31 NOTE — Anesthesia Postprocedure Evaluation (Signed)
Anesthesia Post Note  Patient: Raymond Marsh  Procedure(s) Performed: LAPAROSCOPIC CHOLECYSTECTOMY (N/A ) HERNIA REPAIR UMBILICAL ADULT  Patient location during evaluation: PACU Anesthesia Type: General Level of consciousness: awake and alert Pain management: pain level controlled Vital Signs Assessment: post-procedure vital signs reviewed and stable Respiratory status: spontaneous breathing and respiratory function stable Cardiovascular status: stable Anesthetic complications: no     Last Vitals:  Vitals:   05/31/19 1910 05/31/19 1915  BP:    Pulse:    Resp:    Temp:    SpO2: 92% 92%    Last Pain:  Vitals:   05/31/19 1910  TempSrc:   PainSc: 6                  Andi Mahaffy K

## 2019-05-31 NOTE — Anesthesia Preprocedure Evaluation (Addendum)
Anesthesia Evaluation  Patient identified by MRN, date of birth, ID band Patient awake    Reviewed: Allergy & Precautions, H&P , NPO status , reviewed documented beta blocker date and time   Airway Mallampati: II  TM Distance: >3 FB Neck ROM: full    Dental  (+) Chipped, Missing, Upper Dentures   Pulmonary former smoker,    Pulmonary exam normal        Cardiovascular Normal cardiovascular exam     Neuro/Psych    GI/Hepatic neg GERD  ,  Endo/Other    Renal/GU      Musculoskeletal   Abdominal   Peds  Hematology   Anesthesia Other Findings History reviewed. No pertinent past medical history. Surgical history:  T&A as child. BMI    Body Mass Index: 30.71 kg/m     Reproductive/Obstetrics                            Anesthesia Physical Anesthesia Plan  ASA: II  Anesthesia Plan: General   Post-op Pain Management:    Induction: Intravenous  PONV Risk Score and Plan: Ondansetron and Treatment may vary due to age or medical condition  Airway Management Planned: Oral ETT  Additional Equipment:   Intra-op Plan:   Post-operative Plan: Extubation in OR  Informed Consent: I have reviewed the patients History and Physical, chart, labs and discussed the procedure including the risks, benefits and alternatives for the proposed anesthesia with the patient or authorized representative who has indicated his/her understanding and acceptance.     Dental Advisory Given  Plan Discussed with: CRNA  Anesthesia Plan Comments:         Anesthesia Quick Evaluation

## 2019-05-31 NOTE — Anesthesia Procedure Notes (Signed)
Procedure Name: Intubation Date/Time: 05/31/2019 3:12 PM Performed by: Doreen Salvage, CRNA Pre-anesthesia Checklist: Patient identified, Patient being monitored, Timeout performed, Emergency Drugs available and Suction available Patient Re-evaluated:Patient Re-evaluated prior to induction Oxygen Delivery Method: Circle system utilized Preoxygenation: Pre-oxygenation with 100% oxygen Induction Type: IV induction Ventilation: Mask ventilation without difficulty Laryngoscope Size: Mac and 4 Grade View: Grade I Tube type: Oral Tube size: 7.5 mm Number of attempts: 1 Airway Equipment and Method: Stylet Placement Confirmation: ETT inserted through vocal cords under direct vision,  positive ETCO2 and breath sounds checked- equal and bilateral Secured at: 23 cm Tube secured with: Tape Dental Injury: Teeth and Oropharynx as per pre-operative assessment

## 2019-05-31 NOTE — Progress Notes (Addendum)
[facility] SURGICAL PROGRESS NOTE  Hospital Day(s): 0.   Interval History:  Patient seen and examined no acute events or new complaints overnight.  Patient reports he is doing okay, anxious for surgery No fever, chills, nausea, or emesis He does have a slight leukocytosis this morning to 12.4K; on Zosyn Mild hypokalemia to 3.4 Renal function normal Plan for laparoscopic cholecystectomy today with Dr Lady Gary  Vital signs in last 24 hours: [min-max] current  Temp:  [98.5 F (36.9 C)-98.7 F (37.1 C)] 98.5 F (36.9 C) (05/20 0629) Pulse Rate:  [70-98] 77 (05/20 0629) Resp:  [16-20] 17 (05/20 0629) BP: (119-158)/(75-85) 119/85 (05/20 0629) SpO2:  [94 %-100 %] 96 % (05/20 0629) Weight:  [108.5 kg-111.1 kg] 108.5 kg (05/20 0421)     Height: 6\' 2"  (188 cm) Weight: 108.5 kg BMI (Calculated): 30.7   Intake/Output last 2 shifts:  05/19 0701 - 05/20 0700 In: 1478.2 [I.V.:437.2; IV Piggyback:1041] Out: -    Physical Exam:  Constitutional: alert, cooperative and no distress  Respiratory: breathing non-labored at rest  Cardiovascular: regular rate and sinus rhythm  Gastrointestinal: Soft, non-tender, and non-distended, no rebound/guarding Integumentary: warm, dry, no juandice  Labs:  CBC Latest Ref Rng & Units 05/31/2019 05/30/2019  WBC 4.0 - 10.5 K/uL 12.4(H) 9.0  Hemoglobin 13.0 - 17.0 g/dL 06/01/2019 79.0  Hematocrit 24.0 - 52.0 % 37.1(L) 38.4(L)  Platelets 150 - 400 K/uL 186 203   CMP Latest Ref Rng & Units 05/31/2019 05/30/2019  Glucose 70 - 99 mg/dL 06/01/2019) 532(D)  BUN 6 - 20 mg/dL 14 12  Creatinine 924(Q - 1.24 mg/dL 6.83 4.19  Sodium 6.22 - 145 mmol/L 137 139  Potassium 3.5 - 5.1 mmol/L 3.4(L) 3.9  Chloride 98 - 111 mmol/L 100 101  CO2 22 - 32 mmol/L 28 28  Calcium 8.9 - 10.3 mg/dL 9.1 9.6  Total Protein 6.5 - 8.1 g/dL - 7.7  Total Bilirubin 0.3 - 1.2 mg/dL - 0.8  Alkaline Phos 38 - 126 U/L - 70  AST 15 - 41 U/L - 60(H)  ALT 0 - 44 U/L - 80(H)     Imaging  studies: No new pertinent imaging studies   Assessment/Plan: (ICD-10's: K81.0) 39 y.o. male with acute cholecystitis.    - NPO  - IVF Resuscitation; added KCL to fluids  - Continue IV Abx (Zosyn)  - Plan for laparoscopic cholecystectomy this afternoon with Dr 20 pending OR/Anesthesia availability   - All risks, benefits, and alternatives to above procedure(s) were discussed with the patient, all of his questions were answered to his expressed satisfaction, patient expresses he wishes to proceed, and informed consent was obtained.  - Pain control prn; antiemetics prn  - monitor abdominal examination  - medical management of comorbidities  - DVT prophylaxis; hold    All of the above findings and recommendations were discussed with the patient, and the medical team, and all of patient's questions were answered to his expressed satisfaction.  -- Lady Gary, PA-C [facility] 05/31/2019, 7:13 AM [phone] M-F: 7am - 4pm  I saw and evaluated the patient.  I agree with the above documentation, exam, and plan, which I have edited where appropriate. [phone]  9:36 AM

## 2019-05-31 NOTE — Transfer of Care (Signed)
Immediate Anesthesia Transfer of Care Note  Patient: Raymond Marsh  Procedure(s) Performed: LAPAROSCOPIC CHOLECYSTECTOMY (N/A ) HERNIA REPAIR UMBILICAL ADULT  Patient Location: PACU  Anesthesia Type:General  Level of Consciousness: awake, drowsy and patient cooperative  Airway & Oxygen Therapy: Patient Spontanous Breathing and Patient connected to face mask oxygen  Post-op Assessment: Report given to RN and Post -op Vital signs reviewed and stable  Post vital signs: Reviewed and stable  Last Vitals:  Vitals Value Taken Time  BP 110/59 05/31/19 1814  Temp 36.8 C 05/31/19 1814  Pulse 77 05/31/19 1820  Resp 12 05/31/19 1820  SpO2 92 % 05/31/19 1820  Vitals shown include unvalidated device data.  Last Pain:  Vitals:   05/31/19 1814  TempSrc:   PainSc: Asleep         Complications: No apparent anesthesia complications

## 2019-06-01 LAB — COMPREHENSIVE METABOLIC PANEL
ALT: 156 U/L — ABNORMAL HIGH (ref 0–44)
AST: 130 U/L — ABNORMAL HIGH (ref 15–41)
Albumin: 3.5 g/dL (ref 3.5–5.0)
Alkaline Phosphatase: 62 U/L (ref 38–126)
Anion gap: 10 (ref 5–15)
BUN: 16 mg/dL (ref 6–20)
CO2: 25 mmol/L (ref 22–32)
Calcium: 8.9 mg/dL (ref 8.9–10.3)
Chloride: 101 mmol/L (ref 98–111)
Creatinine, Ser: 1 mg/dL (ref 0.61–1.24)
GFR calc Af Amer: >60 mL/min (ref >60)
GFR calc non Af Amer: >60 mL/min (ref >60)
Glucose, Bld: 169 mg/dL — ABNORMAL HIGH (ref 70–99)
Potassium: 3.8 mmol/L (ref 3.5–5.1)
Sodium: 136 mmol/L (ref 135–145)
Total Bilirubin: 1.3 mg/dL — ABNORMAL HIGH (ref 0.3–1.2)
Total Protein: 7.3 g/dL (ref 6.5–8.1)

## 2019-06-01 LAB — CBC
HCT: 35.8 % — ABNORMAL LOW (ref 39.0–52.0)
Hemoglobin: 12.3 g/dL — ABNORMAL LOW (ref 13.0–17.0)
MCH: 27.5 pg (ref 26.0–34.0)
MCHC: 34.4 g/dL (ref 30.0–36.0)
MCV: 79.9 fL — ABNORMAL LOW (ref 80.0–100.0)
Platelets: 183 10*3/uL (ref 150–400)
RBC: 4.48 MIL/uL (ref 4.22–5.81)
RDW: 13.5 % (ref 11.5–15.5)
WBC: 11.6 10*3/uL — ABNORMAL HIGH (ref 4.0–10.5)
nRBC: 0 % (ref 0.0–0.2)

## 2019-06-01 LAB — HEPATIC FUNCTION PANEL
ALT: 158 U/L — ABNORMAL HIGH (ref 0–44)
AST: 130 U/L — ABNORMAL HIGH (ref 15–41)
Albumin: 3.4 g/dL — ABNORMAL LOW (ref 3.5–5.0)
Alkaline Phosphatase: 63 U/L (ref 38–126)
Bilirubin, Direct: 0.3 mg/dL — ABNORMAL HIGH (ref 0.0–0.2)
Indirect Bilirubin: 1.1 mg/dL — ABNORMAL HIGH (ref 0.3–0.9)
Total Bilirubin: 1.4 mg/dL — ABNORMAL HIGH (ref 0.3–1.2)
Total Protein: 7.4 g/dL (ref 6.5–8.1)

## 2019-06-01 MED ORDER — CYCLOBENZAPRINE HCL 10 MG PO TABS: 5.0000 mg | ORAL_TABLET | Freq: Three times a day (TID) | ORAL | Status: DC | PRN

## 2019-06-01 NOTE — Progress Notes (Signed)
[facility] Hospital Day(s): 1.   Post op day(s): 1 Day Post-Op.   Interval History:  Patient seen and examined Overnight he had issues with pain control and hypoxia, which is improved some throughout the day Pain is primarily at umbilicus, soreness, worse with deep inspiration, some relief with pain medications No fever, chills, nausea, or emesis He is still on ~1L [place] and saturating 93% Leukocytosis improved, WBC 11K this morning He did have new hyperbilirubinemia to 1.3 which was re-checked later in the day and was 1.4 with primarily indirect component He tolerated advancement of diet without issue Mobilizing  Vital signs in last 24 hours: [min-max] current  Temp:  [98.3 F (36.8 C)-99.7 F (37.6 C)] 98.5 F (36.9 C) (05/21 0750) Pulse Rate:  [64-89] 64 (05/21 0750) Resp:  [11-20] 15 (05/21 0750) BP: (110-136)/(59-81) 136/73 (05/21 0750) SpO2:  [89 %-97 %] 93 % (05/21 0750)     Height: 6\' 2"  (188 cm) Weight: 108.5 kg BMI (Calculated): 30.7   Intake/Output last 2 shifts:  05/20 0701 - 05/21 0700 In: 2952.6 [P.O.:60; I.V.:2633.6; IV Piggyback:259] Out: 347 [Urine:625; Blood:50]   Physical Exam:  Constitutional: alert, cooperative and no distress  Respiratory: breathing non-labored at rest, on  Cardiovascular: regular rate and sinus rhythm  Gastrointestinal: Soft, incisional soreness, and non-distended, no rebound/guarding Integumentary: Laparoscopic incisions are CDI with steri-strips, no erythema or drainage   Labs:  CBC Latest Ref Rng & Units 06/01/2019 05/31/2019 05/30/2019  WBC 4.0 - 10.5 K/uL 11.6(H) 12.4(H) 9.0  Hemoglobin 13.0 - 17.0 g/dL 12.3(L) 13.2 13.2  Hematocrit 39.0 - 52.0 % 35.8(L) 37.1(L) 38.4(L)  Platelets 150 - 400 K/uL 183 186 203   CMP Latest Ref Rng & Units 06/01/2019 06/01/2019 05/31/2019  Glucose 70 - 99 mg/dL - 169(H) 139(H)  BUN 6 - 20 mg/dL - 16 14  Creatinine 0.61 - 1.24 mg/dL - 1.00 1.17  Sodium 135 -  145 mmol/L - 136 137  Potassium 3.5 - 5.1 mmol/L - 3.8 3.4(L)  Chloride 98 - 111 mmol/L - 101 100  CO2 22 - 32 mmol/L - 25 28  Calcium 8.9 - 10.3 mg/dL - 8.9 9.1  Total Protein 6.5 - 8.1 g/dL 7.4 7.3 -  Total Bilirubin 0.3 - 1.2 mg/dL 1.4(H) 1.3(H) -  Alkaline Phos 38 - 126 U/L 63 62 -  AST 15 - 41 U/L 130(H) 130(H) -  ALT 0 - 44 U/L 158(H) 156(H) -    Imaging studies: No new pertinent imaging studies   Assessment/Plan:  39 y.o. male with mild hypoxia requiring supplemental O2 and mild hyperbilirubinemia 1 Day Post-Op s/p laparoscopic cholecystectomy and umbilical hernia repair for acute cholecystitis.   - Okay to continue soft diet; discuss post-op dietary recommendations (ex: minimize fatty foods)  - Continue IV Abx (Zosyn); will transition to PO for home  - Wean IVF  - Multi-modal pain management; antiemetics prn  - monitor abdominal examination  - wean to room air; incentive spirometer; pulmonary toilet    - trend labs in the morning   - medical management of comorbid conditions   - mobilize   - Discharge Planning: If bilirubin improved in the AM and off supplemental O2 he should be okay to go home, will need ABx for home   All of the above findings and recommendations were discussed with the patient, and the medical team, and all of patient's questions were answered to his expressed satisfaction.  -- Edison Simon, PA-C [facility] 06/01/2019, 12:58 PM [phone] M-F: 7am - 4pm

## 2019-06-02 LAB — COMPREHENSIVE METABOLIC PANEL
ALT: 124 U/L — ABNORMAL HIGH (ref 0–44)
AST: 71 U/L — ABNORMAL HIGH (ref 15–41)
Albumin: 3.3 g/dL — ABNORMAL LOW (ref 3.5–5.0)
Alkaline Phosphatase: 56 U/L (ref 38–126)
Anion gap: 8 (ref 5–15)
BUN: 19 mg/dL (ref 6–20)
CO2: 30 mmol/L (ref 22–32)
Calcium: 9.1 mg/dL (ref 8.9–10.3)
Chloride: 102 mmol/L (ref 98–111)
Creatinine, Ser: 1.06 mg/dL (ref 0.61–1.24)
GFR calc Af Amer: >60 mL/min (ref >60)
GFR calc non Af Amer: >60 mL/min (ref >60)
Glucose, Bld: 94 mg/dL (ref 70–99)
Potassium: 4.2 mmol/L (ref 3.5–5.1)
Sodium: 140 mmol/L (ref 135–145)
Total Bilirubin: 0.9 mg/dL (ref 0.3–1.2)
Total Protein: 7.3 g/dL (ref 6.5–8.1)

## 2019-06-02 LAB — CBC
HCT: 35.2 % — ABNORMAL LOW (ref 39.0–52.0)
Hemoglobin: 11.6 g/dL — ABNORMAL LOW (ref 13.0–17.0)
MCH: 27.6 pg (ref 26.0–34.0)
MCHC: 33 g/dL (ref 30.0–36.0)
MCV: 83.6 fL (ref 80.0–100.0)
Platelets: 201 10*3/uL (ref 150–400)
RBC: 4.21 MIL/uL — ABNORMAL LOW (ref 4.22–5.81)
RDW: 13.6 % (ref 11.5–15.5)
WBC: 10.9 10*3/uL — ABNORMAL HIGH (ref 4.0–10.5)
nRBC: 0 % (ref 0.0–0.2)

## 2019-06-02 MED ORDER — AMOXICILLIN-POT CLAVULANATE 875-125 MG PO TABS
1.0000 | ORAL_TABLET | Freq: Two times a day (BID) | ORAL | Status: DC
  Administered 2019-06-02: 16:00:00 1 via ORAL
  Filled 2019-06-02: qty 1

## 2019-06-02 MED ORDER — AMOXICILLIN-POT CLAVULANATE 875-125 MG PO TABS: 1.0000 | ORAL_TABLET | Freq: Two times a day (BID) | ORAL | 0 refills | Status: DC

## 2019-06-02 MED ORDER — OXYCODONE HCL 5 MG PO TABS: 5.0000 mg | ORAL_TABLET | ORAL | 0 refills | Status: DC | PRN

## 2019-06-02 NOTE — Progress Notes (Signed)
Plan of care reviewed with pt; medications discussed prior to administration; Incision sites on abdominal region clean/dry/intact; pt voiding spontaneously; on continued antibiotics; safety precautions maintained; call bell within reach; bed low and locked.

## 2019-06-02 NOTE — Progress Notes (Signed)
Reviewed AVS with pt, 2 printed RX with avs

## 2019-06-02 NOTE — Discharge Summary (Signed)
  Patient ID: Raymond Marsh MRN: [id number] DOB/AGE: 1980-04-16 39 y.o.  Admit date: 05/30/2019 Discharge date: 06/02/2019   Discharge Diagnoses:  Active Problems:   Acute calculous cholecystitis   Umbilical hernia without obstruction and without gangrene   Procedures:lap cholecystectomy  Hospital Course:  39 yo maleadmitted with findings consistent with acute cholecystitis and  was taken promptly to the operating room for an uneventful laparoscopic Cholecystectomy.  Patient was for two days due to infection and increase in LFTs. At The time of discharge the patient was ambulating,  pain was controlled.  His vital signs were stable and he was afebrile. LFT were trending down and he had significant clinical improvement.   physical exam at discharge showed a pt  in no acute distress.  Awake and alert.  Abdomen: Soft incisions healing well without infection or peritonitis.  Extremities well-perfused and no edema.  Condition of the patient the time of discharge was stable  Disposition: Discharge disposition: 01-Home or Self Care       Discharge Instructions    Call MD for:  difficulty breathing, headache or visual disturbances   Complete by: As directed    Call MD for:  extreme fatigue   Complete by: As directed    Call MD for:  hives   Complete by: As directed    Call MD for:  persistant dizziness or light-headedness   Complete by: As directed    Call MD for:  persistant nausea and vomiting   Complete by: As directed    Call MD for:  redness, tenderness, or signs of infection (pain, swelling, redness, odor or green/yellow discharge around incision site)   Complete by: As directed    Call MD for:  severe uncontrolled pain   Complete by: As directed    Call MD for:  temperature >100.4   Complete by: As directed    Diet - low sodium heart healthy   Complete by: As directed    Discharge instructions   Complete by: As directed    Shower daily   Increase activity slowly    Complete by: As directed    Lifting restrictions   Complete by: As directed    20 lbs x 6 wks     Allergies as of 06/02/2019   No Known Allergies     Medication List    TAKE these medications   amoxicillin-clavulanate 875-125 MG tablet Commonly known as: AUGMENTIN Take 1 tablet by mouth every 12 (twelve) hours.   ibuprofen 200 MG tablet Commonly known as: ADVIL Take 400 mg by mouth every 6 (six) hours as needed for headache.   magnesium oxide 400 MG tablet Commonly known as: MAG-OX Take 400 mg by mouth daily.   methadone 10 MG/5ML solution Commonly known as: DOLOPHINE Take 110 mg by mouth daily.   oxyCODONE 5 MG immediate release tablet Commonly known as: Oxy IR/ROXICODONE Take 1 tablet (5 mg total) by mouth every 4 (four) hours as needed for severe pain.      Follow-up Information    Donovan Kail, PA-C. Schedule an appointment as soon as possible for a visit in 2 week(s).   Specialty: Physician Assistant Why: s/p lap chole and umbilical hernia repair Contact information: [address] [place] [address] [phone]            Sterling Big, MD FACS

## 2019-06-04 LAB — SURGICAL PATHOLOGY

## 2019-06-15 ENCOUNTER — Ambulatory Visit (INDEPENDENT_AMBULATORY_CARE_PROVIDER_SITE_OTHER): Payer: Self-pay | Admitting: Physician Assistant

## 2019-06-15 ENCOUNTER — Encounter: Payer: Self-pay | Admitting: Physician Assistant

## 2019-06-15 ENCOUNTER — Other Ambulatory Visit: Payer: Self-pay

## 2019-06-15 VITALS — BP 122/85 | HR 79 | Temp 97.9°F | Resp 15 | Ht 74.0 in | Wt 231.0 lb

## 2019-06-15 DIAGNOSIS — K8000 Calculus of gallbladder with acute cholecystitis without obstruction: Secondary | ICD-10-CM

## 2019-06-15 DIAGNOSIS — Z09 Encounter for follow-up examination after completed treatment for conditions other than malignant neoplasm: Secondary | ICD-10-CM

## 2019-06-15 NOTE — Progress Notes (Signed)
[facility] POST-OP OFFICE VISIT  06/15/2019  HPI: Raymond Marsh is a 39 y.o. male 15 days s/p laparoscopic cholecystectomy and umbilical hernia repair with Dr Lady Gary  Overall doing great No issues with pain No fever, chills, nausea, emesis, diarrhea Tolerating PO Ambulating without issues No other complaints.   Vital signs: BP 122/85   Pulse 79   Temp 97.9 F (36.6 C)   Resp 15   Ht 6\' 2"  (1.88 m)   Wt 231 lb (104.8 kg)   SpO2 98%   BMI 29.66 kg/m    Physical Exam: Constitutional: Well appearing male, NAD Abdomen: soft, non-tender, non-distended, no rebound/guarding Skin: Laparoscopic incisions are well healed, no erythema or drainage   Assessment/Plan: This is a 39 y.o. male 15 days s/p laparoscopic cholecystectomy and umbilical hernia repair   - pain control prn; tylenol and/or motrin  - reviewed lifting restrictions; work note provided, 2 more weeks of lifting restrictions   - reviewed pathology: Acute cholecystitis, negative malignancy or dysplasia   - rtc prn  -- 20, PA-C [facility] 06/15/2019, 10:55 AM [phone] M-F: 7am - 4pm

## 2019-06-15 NOTE — Patient Instructions (Signed)
You may return to work but no lifting of more than 10-15 pounds for 2 more weeks.                      Follow-up with our office as needed.  Please call and ask to speak with a nurse if you develop questions or concerns.   GENERAL POST-OPERATIVE PATIENT INSTRUCTIONS   WOUND CARE INSTRUCTIONS:  Keep a dry clean dressing on the wound if there is drainage. The initial bandage may be removed after 24 hours.  Once the wound has quit draining you may leave it open to air.  If clothing rubs against the wound or causes irritation and the wound is not draining you may cover it with a dry dressing during the daytime.  Try to keep the wound dry and avoid ointments on the wound unless directed to do so.  If the wound becomes bright red and painful or starts to drain infected material that is not clear, please contact your physician immediately.  If the wound is mildly pink and has a thick firm ridge underneath it, this is normal, and is referred to as a healing ridge.  This will resolve over the next 4-6 weeks.  BATHING: You may shower if you have been informed of this by your surgeon. However, Please do not submerge in a tub, hot tub, or pool until incisions are completely sealed or have been told by your surgeon that you may do so.  DIET:  You may eat any foods that you can tolerate.  It is a good idea to eat a high fiber diet and take in plenty of fluids to prevent constipation.  If you do become constipated you may want to take a mild laxative or take ducolax tablets on a daily basis until your bowel habits are regular.  Constipation can be very uncomfortable, along with straining, after recent surgery.  ACTIVITY:  You are encouraged to cough and deep breath or use your incentive spirometer if you were given one, every 15-30 minutes when awake.  This will help prevent respiratory complications and low grade fevers post-operatively if you had a general anesthetic.  You may want to hug a pillow when coughing  and sneezing to add additional support to the surgical area, if you had abdominal or chest surgery, which will decrease pain during these times.  You are encouraged to walk and engage in light activity for the next two weeks.  You should not lift more than 20 pounds for 6 weeks after surgery as it could put you at increased risk for complications.  Twenty pounds is roughly equivalent to a plastic bag of groceries. At that time- Listen to your body when lifting, if you have pain when lifting, stop and then try again in a few days. Soreness after doing exercises or activities of daily living is normal as you get back in to your normal routine.  MEDICATIONS:  Try to take narcotic medications and anti-inflammatory medications, such as tylenol, ibuprofen, naprosyn, etc., with food.  This will minimize stomach upset from the medication.  Should you develop nausea and vomiting from the pain medication, or develop a rash, please discontinue the medication and contact your physician.  You should not drive, make important decisions, or operate machinery when taking narcotic pain medication.  SUNBLOCK Use sun block to incision area over the next year if this area will be exposed to sun. This helps decrease scarring and will allow you avoid a  permanent darkened area over your incision.  QUESTIONS:  Please feel free to call our office if you have any questions, and we will be glad to assist you.

## 2020-10-08 ENCOUNTER — Encounter: Payer: Self-pay | Admitting: General Surgery

## 2020-12-06 ENCOUNTER — Encounter: Payer: Self-pay | Admitting: Intensive Care

## 2020-12-06 ENCOUNTER — Emergency Department
Admission: EM | Admit: 2020-12-06 | Discharge: 2020-12-06 | Disposition: A | Discharge status: Home / Self Care | Payer: No Typology Code available for payment source | Attending: Emergency Medicine | Admitting: Emergency Medicine

## 2020-12-06 ENCOUNTER — Emergency Department: Payer: No Typology Code available for payment source

## 2020-12-06 ENCOUNTER — Other Ambulatory Visit: Payer: Self-pay

## 2020-12-06 DIAGNOSIS — M25461 Effusion, right knee: Secondary | ICD-10-CM | POA: Insufficient documentation

## 2020-12-06 DIAGNOSIS — M25561 Pain in right knee: Secondary | ICD-10-CM | POA: Insufficient documentation

## 2020-12-06 LAB — BODY FLUID CELL COUNT WITH DIFFERENTIAL
Eos, Fluid: 0 %
Monocyte-Macrophage-Serous Fluid: 29 %
Neutrophil Count, Fluid: 71 %
Total Nucleated Cell Count, Fluid: 130692 cu mm

## 2020-12-06 MED ORDER — LIDOCAINE HCL (PF) 1 % IJ SOLN
5.0000 mL | Freq: Once | INTRAMUSCULAR | Status: DC
  Filled 2020-12-06: qty 5

## 2020-12-06 NOTE — ED Provider Notes (Signed)
  Physical Exam  BP 118/82 (BP Location: Right Arm)   Pulse 69   Temp 98.6 F (37 C) (Oral)   Resp 20   Ht 6\' 2"  (1.88 m)   Wt 104.3 kg   SpO2 97%   BMI 29.53 kg/m   Physical Exam Constitutional:      Appearance: Normal appearance.  HENT:     Head: Normocephalic and atraumatic.     Nose: Nose normal.  Eyes:     Pupils: Pupils are equal, round, and reactive to light.  Cardiovascular:     Rate and Rhythm: Normal rate and regular rhythm.  Pulmonary:     Effort: Pulmonary effort is normal.  Abdominal:     General: Abdomen is flat.     Palpations: Abdomen is soft.  Musculoskeletal:     Cervical back: Normal range of motion.     Comments: Diffuse swelling in the right lower extremity extending from knee to ankle.  Skin:    General: Skin is warm and dry.  Neurological:     Mental Status: He is alert and oriented to person, place, and time.  Psychiatric:        Mood and Affect: Mood normal.        Behavior: Behavior normal.        Thought Content: Thought content normal.        Judgment: Judgment normal.    ED Course/Procedures   Clinical Course as of 12/06/20 2222  Sat Dec 06, 2020  2205 Pulse Rate: 69 [BS]    Clinical Course User Index [BS] 2206, PA    Procedures  MDM  I took over care of this patient at approximately 2100 from [facility].  Patient is currently stable at this time.  Pending synovial fluid cell count with differential and by fluid culture with gram stain for septic joint rule out.    Results are pending at this time.  Patient has requested to be discharged from the emergency department immediately, stating he is unable to wait any longer due to responsibilities and obligations that he has at home.  I discussed with the patient the risks of leaving the emergency department and strongly advised that he stay in order to receive the results of the test, which may indicate a septic joint and require admission for IV antibiotics.   The patient states that he is willing to incur this risk and will follow-up with his primary care provider to view the results of his tests.  We will plan to discharge        [place], Varney Daily 12/06/20 2223    2224, MD 12/06/20 2224

## 2020-12-06 NOTE — ED Triage Notes (Signed)
Presents with swelling and pain to right knee and ankle pain.

## 2020-12-06 NOTE — ED Provider Notes (Signed)
HPI: Pt is a 40 y.o. male who presents with complaints of knee pain. Started 2 days ago.  Reports diffuse tenderness and swelling across his right knee and right calf.  Significant tenderness underneath the knee in the popliteal region.  Additionally reports pain in his hands bilaterally.  ROS: Denies fever, chest pain, vomiting  History reviewed. No pertinent past medical history. Vitals:   12/06/20 1622  BP: 129/80  Pulse: 95  Resp: 16  Temp: 98.6 F (37 C)  SpO2: 97%    Focused Physical Exam: Gen: No acute distress Head: atraumatic, normocephalic Eyes: Extraocular movements grossly intact; conjunctiva clear CV: RRR Lung: No increased WOB, no stridor GI: ND, no obvious masses Neuro: Alert and awake Other: Diffuse swelling in the right lower extremity compared to the left.  No erythema.   Medical Decision Making and Plan: Given the patient's initial medical screening exam, the following diagnostic evaluation has been ordered. The patient will be placed in the appropriate treatment space, once one is available, to complete the evaluation and treatment. I have discussed the plan of care with the patient and I have advised the patient that an ED physician or mid-level practitioner will reevaluate their condition after the test results have been received, as the results may give them additional insight into the type of treatment they may need.   Diagnostics: Right lower extremity ultrasound (DVT rule out)  Treatments: none immediately   Varney Daily, PA 12/06/20 1642    Phineas Semen, MD 12/06/20 [phone]

## 2020-12-06 NOTE — ED Provider Notes (Signed)
[facility] Emergency Department Provider Note ____________________________________________  Time seen: 6  I have reviewed the triage vital signs and the nursing notes.  HISTORY  Chief Complaint  Knee Pain and Ankle Pain   HPI Raymond Marsh is a 40 y.o. male presents to the ED with pain and swelling to the right knee with some residual swelling to the right ankle.  Patient denies any preceding injury, trauma, or fall.  He gives no history of chronic ongoing knee pain or arthritis.  He presents with moderate swelling to the knee and disability with attempts to bear weight and walk.  He denies any fevers, chills, sweats, or chest pain.  He reports pain was at its maximum last night which was about a 10 out of 10.  History reviewed. No pertinent past medical history.  Patient Active Problem List   Diagnosis Date Noted   Umbilical hernia without obstruction and without gangrene    Acute calculous cholecystitis 05/30/2019    Past Surgical History:  Procedure Laterality Date   CHOLECYSTECTOMY N/A 05/31/2019   Procedure: LAPAROSCOPIC CHOLECYSTECTOMY;  Surgeon: Duanne Guess, MD;  Location: [facility] ORS;  Service: General;  Laterality: N/A;   UMBILICAL HERNIA REPAIR  05/31/2019   Procedure: HERNIA REPAIR UMBILICAL ADULT;  Surgeon: Duanne Guess, MD;  Location: [facility] ORS;  Service: General;;    Prior to Admission medications   Medication Sig Start Date End Date Taking? Authorizing Provider  ibuprofen (ADVIL) 200 MG tablet Take 400 mg by mouth every 6 (six) hours as needed for headache.    [provider]  magnesium oxide (MAG-OX) 400 MG tablet Take 400 mg by mouth daily.    [provider]  methadone (DOLOPHINE) 10 MG/5ML solution Take 110 mg by mouth daily.    [provider]    Allergies Patient has no known allergies.  History reviewed. No pertinent family history.  Social History Social History   Tobacco Use   Smoking  status: Former    Types: Cigarettes   Smokeless tobacco: Never  Vaping Use   Vaping Use: Never used  Substance Use Topics   Alcohol use: Not Currently   Drug use: Not Currently    Review of Systems  Constitutional: Negative for fever. Eyes: Negative for visual changes. ENT: Negative for sore throat. Cardiovascular: Negative for chest pain. Respiratory: Negative for shortness of breath. Gastrointestinal: Negative for abdominal pain, vomiting and diarrhea. Genitourinary: Negative for dysuria. Musculoskeletal: Negative for back pain.  Right knee pain and swelling as above. Skin: Negative for rash. Neurological: Negative for headaches, focal weakness or numbness. ____________________________________________  PHYSICAL EXAM:  VITAL SIGNS: ED Triage Vitals  Enc Vitals Group     BP 12/06/20 1622 129/80     Pulse Rate 12/06/20 1622 95     Resp 12/06/20 1622 16     Temp 12/06/20 1622 98.6 F (37 C)     Temp Source 12/06/20 1622 Oral     SpO2 12/06/20 1622 97 %     Weight 12/06/20 1618 230 lb (104.3 kg)     Height 12/06/20 1618 6\' 2"  (1.88 m)     Head Circumference --      Peak Flow --      Pain Score 12/06/20 1618 9     Pain Loc --      Pain Edu? --      Excl. in GC? --     Constitutional: Alert and oriented. Well appearing and in no distress. Head: Normocephalic and  atraumatic. Eyes: Conjunctivae are normal. Normal extraocular movements Cardiovascular: Normal rate, regular rhythm. Normal distal pulses. Respiratory: Normal respiratory effort. No wheezes/rales/rhonchi. Gastrointestinal: Soft and nontender. No distention. Musculoskeletal: Right knee without obvious deformity or dislocation.  Patient with a moderate effusion appreciated on the right.  Patient with decreased flexion range secondary to effusion.  No popliteal space fullness is appreciated.  No significant calf or Achilles tenderness noted distally.  There is moderate joint effusion to the right ankle but no  laxity or signs of internal derangement.  Nontender with normal range of motion in all extremities.  Neurologic: Antalgic gait without ataxia. Normal speech and language. No gross focal neurologic deficits are appreciated. Skin:  Skin is warm, dry and intact. No rash noted. Psychiatric: Mood and affect are normal. Patient exhibits appropriate insight and judgment. ____________________________________________    {LABS (pertinent positives/negatives)  Labs Reviewed  BODY FLUID CELL COUNT WITH DIFFERENTIAL - Abnormal; Notable for the following components:      Result Value   Color, Fluid RED (*)    Appearance, Fluid TURBID (*)    All other components within normal limits  BODY FLUID CULTURE W GRAM STAIN  ____________________________________________  {EKG  ____________________________________________   RADIOLOGY Official radiology report(s): No results found. ____________________________________________  PROCEDURES   .Joint Aspiration/Arthrocentesis  Date/Time: 12/06/2020 7:56 PM Performed by: Lissa Hoard, PA-C Authorized by: Lissa Hoard, PA-C   Consent:    Consent obtained:  Verbal   Consent given by:  Patient   Risks, benefits, and alternatives were discussed: yes     Risks discussed:  Bleeding, infection, pain and incomplete drainage   Alternatives discussed:  Referral Universal protocol:    Test results available: yes     Imaging studies available: yes     Site/side marked: yes     Immediately prior to procedure, a time out was called: yes     Patient identity confirmed:  Verbally with patient Location:    Location:  Knee   Knee:  R knee Anesthesia:    Anesthesia method:  Local infiltration Procedure details:    Needle gauge:  18 G   Ultrasound guidance: no     Approach:  Superior   Aspirate amount:  20cc   Aspirate characteristics:  Serous and bloody   Steroid injected: no     Specimen collected: yes   Post-procedure details:     Dressing:  Adhesive bandage   Procedure completion:  Tolerated well, no immediate complications ____________________________________________   INITIAL IMPRESSION / ASSESSMENT AND PLAN / ED COURSE  As part of my medical decision making, I reviewed the following data within the electronic MEDICAL RECORD NUMBER Labs reviewed pending, Radiograph reviewed WNL, and Notes from prior ED visits   DDX: joint effusion, gout flare, septic joint, pseudogout  Patient ED evaluation with right knee pain and swelling.  He presents with pain instability to the right knee without a significant recent injury.  Patient is evaluated for his complaints, found to have a moderate joint effusion without radiologic evidence of acute fracture or underlying generative changes.  Patient consented to a joint aspiration and which is noted fluid was sent for culture and cell count.  Patient is pending those results at this time.  Care will be transferred to my colleague for final disposition.  Patient is stable at this time in no acute pain.  This patient case will be transferred to my colleague Charlsie Quest for final disposition after the synovial cell count is  completed.  Patient is aware of the pending test.  Raymond Marsh was evaluated in Emergency Department on 12/11/2020 for the symptoms described in the history of present illness. He was evaluated in the context of the global COVID-19 pandemic, which necessitated consideration that the patient might be at risk for infection with the SARS-CoV-2 virus that causes COVID-19. Institutional protocols and algorithms that pertain to the evaluation of patients at risk for COVID-19 are in a state of rapid change based on information released by regulatory bodies including the CDC and federal and state organizations. These policies and algorithms were followed during the patient's care in the ED. ____________________________________________  FINAL CLINICAL IMPRESSION(S) / ED  DIAGNOSES  Final diagnoses:  Acute pain of right knee  Effusion of right knee      Lissa Hoard, PA-C 12/11/20 1624    Arnaldo Natal, MD 12/19/20 2038

## 2020-12-10 LAB — BODY FLUID CULTURE W GRAM STAIN: Culture: NO GROWTH

## 2021-03-03 ENCOUNTER — Other Ambulatory Visit: Payer: Self-pay

## 2021-03-03 ENCOUNTER — Encounter: Payer: Self-pay | Admitting: Nurse Practitioner

## 2021-03-03 ENCOUNTER — Ambulatory Visit: Payer: No Typology Code available for payment source | Admitting: Nurse Practitioner

## 2021-03-03 VITALS — BP 124/80 | HR 66 | Temp 98.9°F | Ht 74.2 in | Wt 237.6 lb

## 2021-03-03 DIAGNOSIS — M05762 Rheumatoid arthritis with rheumatoid factor of left knee without organ or systems involvement: Secondary | ICD-10-CM

## 2021-03-03 DIAGNOSIS — Z0000 Encounter for general adult medical examination without abnormal findings: Secondary | ICD-10-CM | POA: Diagnosis not present

## 2021-03-03 DIAGNOSIS — Z136 Encounter for screening for cardiovascular disorders: Secondary | ICD-10-CM

## 2021-03-03 DIAGNOSIS — Z23 Encounter for immunization: Secondary | ICD-10-CM

## 2021-03-03 DIAGNOSIS — M069 Rheumatoid arthritis, unspecified: Secondary | ICD-10-CM | POA: Insufficient documentation

## 2021-03-03 DIAGNOSIS — M05761 Rheumatoid arthritis with rheumatoid factor of right knee without organ or systems involvement: Secondary | ICD-10-CM | POA: Diagnosis not present

## 2021-03-03 DIAGNOSIS — Z7952 Long term (current) use of systemic steroids: Secondary | ICD-10-CM

## 2021-03-03 LAB — URINALYSIS, ROUTINE W REFLEX MICROSCOPIC
Bilirubin, UA: NEGATIVE
Glucose, UA: NEGATIVE
Leukocytes,UA: NEGATIVE
Nitrite, UA: NEGATIVE
Specific Gravity, UA: 1.02 (ref 1.005–1.030)
Urobilinogen, Ur: 0.2 mg/dL (ref 0.2–1.0)
pH, UA: 6.5 (ref 5.0–7.5)

## 2021-03-03 LAB — MICROSCOPIC EXAMINATION
Bacteria, UA: NONE SEEN
Epithelial Cells (non renal): NONE SEEN /hpf (ref 0–10)
WBC, UA: NONE SEEN /hpf (ref 0–5)

## 2021-03-03 NOTE — Progress Notes (Deleted)
° °  There were no vitals taken for this visit.   Subjective:    Patient ID: Raymond Marsh, male    DOB: [birth year], 41 y.o.   MRN: [id number]  HPI: Raymond Marsh is a 41 y.o. male  No chief complaint on file.  Patient presents to clinic to establish care with new PCP.  Introduced to Designer, jewellery role and practice setting.  All questions answered.  Discussed provider/patient relationship and expectations.  Patient reports a history of ***. Patient denies a history of: Hypertension, Elevated Cholesterol, Diabetes, Thyroid problems, Depression, Anxiety, Neurological problems, and Abdominal problems.   Active Ambulatory Problems    Diagnosis Date Noted   Acute calculous cholecystitis 123XX123   Umbilical hernia without obstruction and without gangrene    Resolved Ambulatory Problems    Diagnosis Date Noted   No Resolved Ambulatory Problems   No Additional Past Medical History   Past Surgical History:  Procedure Laterality Date   CHOLECYSTECTOMY N/A 05/31/2019   Procedure: LAPAROSCOPIC CHOLECYSTECTOMY;  Surgeon: Fredirick Maudlin, MD;  Location: [facility] ORS;  Service: General;  Laterality: N/A;   UMBILICAL HERNIA REPAIR  05/31/2019   Procedure: HERNIA REPAIR UMBILICAL ADULT;  Surgeon: Fredirick Maudlin, MD;  Location: [facility] ORS;  Service: General;;   No family history on file.   Review of Systems  Per HPI unless specifically indicated above     Objective:    There were no vitals taken for this visit.  Wt Readings from Last 3 Encounters:  12/06/20 230 lb (104.3 kg)  06/15/19 231 lb (104.8 kg)  05/31/19 239 lb 3.2 oz (108.5 kg)    Physical Exam  Results for orders placed or performed during the hospital encounter of 12/06/20  Body fluid culture w Gram Stain   Specimen: KNEE; Body Fluid  Result Value Ref Range   Specimen Description      KNEE Performed at [facility], [address], [place], [place] [address]    Special Requests      RIGHT Performed at  [facility], [place], [place] [address]    Gram Stain      MODERATE WBC PRESENT, PREDOMINANTLY PMN NO ORGANISMS SEEN    Culture      NO GROWTH Performed at [facility], [place] [address], [place], [place] [address]    Report Status 12/10/2020 FINAL   Body fluid cell count with differential  Result Value Ref Range   Fluid Type-FCT SYNOVIAL    Color, Fluid RED (A) YELLOW   Appearance, Fluid TURBID (A) CLEAR   Total Nucleated Cell Count, Fluid 130,692 cu mm   Neutrophil Count, Fluid 71 %   Monocyte-Macrophage-Serous Fluid 29 %   Eos, Fluid 0 %   Other Cells, Fluid RED BLOOD CELLS PRESENT %      Assessment & Plan:   Problem List Items Addressed This Visit   None    Follow up plan: No follow-ups on file.

## 2021-03-03 NOTE — Assessment & Plan Note (Signed)
New diagnosis.  Recently started on Methotrexate.  Seeing improvement in symptoms. Followed by [facility]. Continue to follow up with Rheum.

## 2021-03-03 NOTE — Progress Notes (Signed)
BP 124/80    Pulse 66    Temp 98.9 F (37.2 C) (Oral)    Ht 6' 2.2" (1.885 m)    Wt 237 lb 9.6 oz (107.8 kg)    SpO2 98%    BMI 30.34 kg/m    Subjective:    Patient ID: Raymond Marsh, male    DOB: 10/02/1980, 41 y.o.   MRN: [id number]  HPI: Raymond Marsh is a 41 y.o. male presenting on 03/03/2021 for comprehensive medical examination. Current medical complaints include:none  He currently lives with: Interim Problems from his last visit: no  Patient presents to clinic to establish care with new PCP.  Introduced to Publishing rights manager role and practice setting.  All questions answered.  Discussed provider/patient relationship and expectations.  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   Patient denies a history of: Hypertension, Elevated Cholesterol, Diabetes, Thyroid problems, Depression, Anxiety, Neurological problems, COPD, ASTHMA and Abdominal problems.   Active Ambulatory Problems    Diagnosis Date Noted   Acute calculous cholecystitis 05/30/2019   Umbilical hernia without obstruction and without gangrene    Resolved Ambulatory Problems    Diagnosis Date Noted   No Resolved Ambulatory Problems   Past Medical History:  Diagnosis Date   Rheumatoid arthritis (HCC)    Past Surgical History:  Procedure Laterality Date   CHOLECYSTECTOMY N/A 05/31/2019   Procedure: LAPAROSCOPIC CHOLECYSTECTOMY;  Surgeon: Duanne Guess, MD;  Location: [facility] ORS;  Service: General;  Laterality: N/A;   UMBILICAL HERNIA REPAIR  05/31/2019   Procedure: HERNIA REPAIR UMBILICAL ADULT;  Surgeon: Duanne Guess, MD;  Location: [facility] ORS;  Service: General;;   Family History  Problem Relation Age of Onset   Autoimmune disease Mother    Diabetes Paternal Grandmother     Depression Screen done today and results listed below:  Depression screen [facility] 2/9 03/03/2021  Decreased Interest 0  Down, Depressed,  Hopeless 0  PHQ - 2 Score 0    The patient does not have a history of falls. I did complete a risk assessment for falls. A plan of care for falls was documented.   Past Medical History:  Past Medical History:  Diagnosis Date   Rheumatoid arthritis (HCC)     Surgical History:  Past Surgical History:  Procedure Laterality Date   CHOLECYSTECTOMY N/A 05/31/2019   Procedure: LAPAROSCOPIC CHOLECYSTECTOMY;  Surgeon: Duanne Guess, MD;  Location: [facility] ORS;  Service: General;  Laterality: N/A;   UMBILICAL HERNIA REPAIR  05/31/2019   Procedure: HERNIA REPAIR UMBILICAL ADULT;  Surgeon: Duanne Guess, MD;  Location: [facility] ORS;  Service: General;;    Medications:  Current Outpatient Medications on File Prior to Visit  Medication Sig   folic acid (FOLVITE) 1 MG tablet Take 1 mg by mouth daily.   ibuprofen (ADVIL) 800 MG tablet Take 800 mg by mouth every 6 (six) hours as needed.   methadone (DOLOPHINE) 10 MG/ML solution Take 110 mg by mouth daily.   methotrexate (RHEUMATREX) 2.5 MG tablet Take 15 mg by mouth once a week.   No current facility-administered medications on file prior to visit.    Allergies:  No Known Allergies  Social History:  Social History   Socioeconomic History   Marital status: Married    Spouse name: Not on file   Number of children: Not on file   Years of education: Not on file   Highest education level: Not on file  Occupational  History   Not on file  Tobacco Use   Smoking status: Every Day    Packs/day: 0.50    Types: Cigarettes   Smokeless tobacco: Never  Vaping Use   Vaping Use: Never used  Substance and Sexual Activity   Alcohol use: Not Currently   Drug use: Not Currently   Sexual activity: Yes  Marsh Topics Concern   Not on file  Social History Narrative   Not on file   Social Determinants of Health   Financial Resource Strain: Not on file  Food Insecurity: Not on file  Transportation Needs: Not on file  Physical Activity: Not on  file  Stress: Not on file  Social Connections: Not on file  Intimate Partner Violence: Not on file   Social History   Tobacco Use  Smoking Status Every Day   Packs/day: 0.50   Types: Cigarettes  Smokeless Tobacco Never   Social History   Substance and Sexual Activity  Alcohol Use Not Currently    Family History:  Family History  Problem Relation Age of Onset   Autoimmune disease Mother    Diabetes Paternal Grandmother     Past medical history, surgical history, medications, allergies, family history and social history reviewed with patient today and changes made to appropriate areas of the chart.   Review of Systems  Eyes:  Negative for blurred vision and double vision.  Respiratory:  Negative for shortness of breath.   Cardiovascular:  Negative for chest pain, palpitations and leg swelling.  Neurological:  Negative for dizziness and headaches.  All Marsh ROS negative except what is listed above and in the HPI.      Objective:    BP 124/80    Pulse 66    Temp 98.9 F (37.2 C) (Oral)    Ht 6' 2.2" (1.885 m)    Wt 237 lb 9.6 oz (107.8 kg)    SpO2 98%    BMI 30.34 kg/m   Wt Readings from Last 3 Encounters:  03/03/21 237 lb 9.6 oz (107.8 kg)  12/06/20 230 lb (104.3 kg)  06/15/19 231 lb (104.8 kg)    Physical Exam Vitals and nursing note reviewed.  Constitutional:      General: He is not in acute distress.    Appearance: Normal appearance. He is normal weight. He is not ill-appearing, toxic-appearing or diaphoretic.  HENT:     Head: Normocephalic.     Right Ear: Tympanic membrane, ear canal and external ear normal.     Left Ear: Tympanic membrane, ear canal and external ear normal.     Nose: Nose normal. No congestion or rhinorrhea.     Mouth/Throat:     Mouth: Mucous membranes are moist.  Eyes:     General:        Right eye: No discharge.        Left eye: No discharge.     Extraocular Movements: Extraocular movements intact.     Conjunctiva/sclera:  Conjunctivae normal.     Pupils: Pupils are equal, round, and reactive to light.  Cardiovascular:     Rate and Rhythm: Normal rate and regular rhythm.     Heart sounds: No murmur heard. Pulmonary:     Effort: Pulmonary effort is normal. No respiratory distress.     Breath sounds: Normal breath sounds. No wheezing, rhonchi or rales.  Abdominal:     General: Abdomen is flat. Bowel sounds are normal. There is no distension.     Palpations: Abdomen is soft.  Tenderness: There is no abdominal tenderness. There is no guarding.  Musculoskeletal:     Cervical back: Normal range of motion and neck supple.  Skin:    General: Skin is warm and dry.     Capillary Refill: Capillary refill takes less than 2 seconds.  Neurological:     General: No focal deficit present.     Mental Status: He is alert and oriented to person, place, and time.     Cranial Nerves: No cranial nerve deficit.     Motor: No weakness.     Deep Tendon Reflexes: Reflexes normal.  Psychiatric:        Mood and Affect: Mood normal.        Behavior: Behavior normal.        Thought Content: Thought content normal.        Judgment: Judgment normal.    Results for orders placed or performed during the hospital encounter of 12/06/20  Body fluid culture w Gram Stain   Specimen: KNEE; Body Fluid  Result Value Ref Range   Specimen Description      KNEE Performed at [facility], [address] [place], [place] [address]    Special Requests      RIGHT Performed at [facility], [address] [place], [place] [address]    Gram Stain      MODERATE WBC PRESENT, PREDOMINANTLY PMN NO ORGANISMS SEEN    Culture      NO GROWTH Performed at [facility], [address] [place], [place] [address]    Report Status 12/10/2020 FINAL   Body fluid cell count with differential  Result Value Ref Range   Fluid Type-FCT SYNOVIAL    Color, Fluid RED (A) YELLOW   Appearance, Fluid TURBID (A) CLEAR    Total Nucleated Cell Count, Fluid 130,692 cu mm   Neutrophil Count, Fluid 71 %   Monocyte-Macrophage-Serous Fluid 29 %   Eos, Fluid 0 %   Marsh Cells, Fluid RED BLOOD CELLS PRESENT %      Assessment & Plan:   Problem List Items Addressed This Visit   None Visit Diagnoses     Need for diphtheria-tetanus-pertussis (Tdap) vaccine    -  Primary   Relevant Orders   Tdap vaccine greater than or equal to 7yo IM (Completed)        Discussed aspirin prophylaxis for myocardial infarction prevention and decision was it was not indicated  LABORATORY TESTING:  Health maintenance labs ordered today as discussed above.     IMMUNIZATIONS:   - Tdap: Tetanus vaccination status reviewed: last tetanus booster within 10 years. - Influenza: Refused - Pneumovax: Not applicable - Prevnar: Not applicable - COVID: Refused - HPV: Not applicable - Shingrix vaccine: Not applicable  SCREENING: - Colonoscopy: Not applicable  Discussed with patient purpose of the colonoscopy is to detect colon cancer at curable precancerous or early stages   - AAA Screening: Not applicable  -Hearing Test: Not applicable  -Spirometry: Not applicable   PATIENT COUNSELING:    Sexuality: Discussed sexually transmitted diseases, partner selection, use of condoms, avoidance of unintended pregnancy  and contraceptive alternatives.   Advised to avoid cigarette smoking.  I discussed with the patient that most people either abstain from alcohol or drink within safe limits (<=14/week and <=4 drinks/occasion for males, <=7/weeks and <= 3 drinks/occasion for females) and that the risk for alcohol disorders and Marsh health effects rises proportionally with the number of drinks per week and  how often a drinker exceeds daily limits.  Discussed cessation/primary prevention of drug use and availability of treatment for abuse.   Diet: Encouraged to adjust caloric intake to maintain  or achieve ideal body weight, to reduce intake  of dietary saturated fat and total fat, to limit sodium intake by avoiding high sodium foods and not adding table salt, and to maintain adequate dietary potassium and calcium preferably from fresh fruits, vegetables, and low-fat dairy products.    stressed the importance of regular exercise  Injury prevention: Discussed safety belts, safety helmets, smoke detector, smoking near bedding or upholstery.   Dental health: Discussed importance of regular tooth brushing, flossing, and dental visits.   Follow up plan: NEXT PREVENTATIVE PHYSICAL DUE IN 1 YEAR. No follow-ups on file.

## 2021-03-04 LAB — CBC WITH DIFFERENTIAL/PLATELET
Basophils Absolute: 0.1 10*3/uL (ref 0.0–0.2)
Basos: 1 %
EOS (ABSOLUTE): 0.2 10*3/uL (ref 0.0–0.4)
Eos: 3 %
Hematocrit: 40.4 % (ref 37.5–51.0)
Hemoglobin: 13.4 g/dL (ref 13.0–17.7)
Immature Grans (Abs): 0 10*3/uL (ref 0.0–0.1)
Immature Granulocytes: 0 %
Lymphocytes Absolute: 2.1 10*3/uL (ref 0.7–3.1)
Lymphs: 30 %
MCH: 27.9 pg (ref 26.6–33.0)
MCHC: 33.2 g/dL (ref 31.5–35.7)
MCV: 84 fL (ref 79–97)
Monocytes Absolute: 0.6 10*3/uL (ref 0.1–0.9)
Monocytes: 8 %
Neutrophils Absolute: 4 10*3/uL (ref 1.4–7.0)
Neutrophils: 58 %
Platelets: 216 10*3/uL (ref 150–450)
RBC: 4.8 x10E6/uL (ref 4.14–5.80)
RDW: 16 % — ABNORMAL HIGH (ref 11.6–15.4)
WBC: 7 10*3/uL (ref 3.4–10.8)

## 2021-03-04 LAB — COMPREHENSIVE METABOLIC PANEL
ALT: 51 IU/L — ABNORMAL HIGH (ref 0–44)
AST: 33 IU/L (ref 0–40)
Albumin/Globulin Ratio: 1.9 (ref 1.2–2.2)
Albumin: 4.9 g/dL (ref 4.0–5.0)
Alkaline Phosphatase: 77 IU/L (ref 44–121)
BUN/Creatinine Ratio: 10 (ref 9–20)
BUN: 11 mg/dL (ref 6–24)
Bilirubin Total: 0.5 mg/dL (ref 0.0–1.2)
CO2: 26 mmol/L (ref 20–29)
Calcium: 9.8 mg/dL (ref 8.7–10.2)
Chloride: 99 mmol/L (ref 96–106)
Creatinine, Ser: 1.14 mg/dL (ref 0.76–1.27)
Globulin, Total: 2.6 g/dL (ref 1.5–4.5)
Glucose: 96 mg/dL (ref 70–99)
Potassium: 3.3 mmol/L — ABNORMAL LOW (ref 3.5–5.2)
Sodium: 139 mmol/L (ref 134–144)
Total Protein: 7.5 g/dL (ref 6.0–8.5)
eGFR: 83 mL/min/{1.73_m2} (ref >59)

## 2021-03-04 LAB — LIPID PANEL
Chol/HDL Ratio: 6 ratio — ABNORMAL HIGH (ref 0.0–5.0)
Cholesterol, Total: 238 mg/dL — ABNORMAL HIGH (ref 100–199)
HDL: 40 mg/dL (ref >39)
LDL Chol Calc (NIH): 167 mg/dL — ABNORMAL HIGH (ref 0–99)
Triglycerides: 166 mg/dL — ABNORMAL HIGH (ref 0–149)
VLDL Cholesterol Cal: 31 mg/dL (ref 5–40)

## 2021-03-04 LAB — HEMOGLOBIN A1C
Est. average glucose Bld gHb Est-mCnc: 117 mg/dL
Hgb A1c MFr Bld: 5.7 % — ABNORMAL HIGH (ref 4.8–5.6)

## 2021-03-04 LAB — TSH: TSH: 2.99 u[IU]/mL (ref 0.450–4.500)

## 2021-03-04 MED ORDER — ROSUVASTATIN CALCIUM 5 MG PO TABS: 5.0000 mg | ORAL_TABLET | Freq: Every day | ORAL | 1 refills | Status: AC

## 2021-03-04 NOTE — Progress Notes (Signed)
Medication sent to the pharmacy.

## 2021-03-04 NOTE — Progress Notes (Signed)
Please let patient know that his cholesterol is elevated.  His cardiac risk score puts him at moderate to high risk of having a stroke or heart attack over the next 10 years.  I recommend that he start a statin called crestor 5mg  daily.  The goal will be to increase this to 20mg  daily if patient tolerates it well.  If he agrees to the medication I can send it to the pharmacy.    His blood work also shows that he is prediabetic.  I recommend he decrease his soda consumption and replace with water.  No medications at this time.  We will continue to monitor it in the future.  Otherwise his blood work looks good.  I'd like to see him back in 6 months to recheck his lab work.  Please let me know if he has any questions.   The 10-year ASCVD risk score (Arnett DK, et al., 2019) is: 6.8%   Values used to calculate the score:     Age: 41 years     Sex: Male     Is Non-Hispanic African American: No     Diabetic: No     Tobacco smoker: Yes     Systolic Blood Pressure: 124 mmHg     Is BP treated: No     HDL Cholesterol: 40 mg/dL     Total Cholesterol: 238 mg/dL

## 2021-03-04 NOTE — Addendum Note (Signed)
Addended by: Larae Grooms on: 03/04/2021 01:08 PM   Modules accepted: Orders

## 2021-05-22 ENCOUNTER — Other Ambulatory Visit: Payer: Self-pay | Admitting: Nurse Practitioner

## 2021-05-25 ENCOUNTER — Ambulatory Visit: Payer: Self-pay | Admitting: *Deleted

## 2021-05-25 NOTE — Telephone Encounter (Signed)
Summary: rx concern  ? The patient would like to speak with a member of staff when possible about their previously requested refill of rosuvastatin (CRESTOR) 5 MG tablet [[id number]]  ? ?Please contact further when available   ?  ? ?

## 2021-05-25 NOTE — Telephone Encounter (Signed)
Reason for Disposition ?? Patient has refills remaining on their prescription ? ?Answer Assessment - Initial Assessment Questions ?1. DRUG NAME: "What medicine do you need to have refilled?" ?    Rosuvastatin 5 mg ?2. REFILLS REMAINING: "How many refills are remaining?" (Note: The label on the medicine or pill bottle will show how many refills are remaining. If there are no refills remaining, then a renewal may be needed.) ?    1-refill ?3. EXPIRATION DATE: "What is the expiration date?" (Note: The label states when the prescription will expire, and thus can no longer be refilled.) ?    na ?4. PRESCRIBING HCP: "Who prescribed it?" Reason: If prescribed by specialist, call should be referred to that group. ?    PCP ?Rx: ?rosuvastatin (CRESTOR) 5 MG tablet 90 tablet 1 03/04/2021   ?Sig - Route: Take 1 tablet (5 mg total) by mouth daily. - Oral  ?Sent to pharmacy as: rosuvastatin (CRESTOR) 5 MG tablet  ?E-Prescribing Status: Receipt confirmed by pharmacy (03/04/2021 ?1:09 PM EST)  ? ?Call to pharmacy- RF are on file- patient insurance only allows #30- but he has RF- they will get it ready for him ? ?Protocols used: Medication Refill and Renewal Call-A-AH ? ?

## 2021-05-25 NOTE — Telephone Encounter (Signed)
Refusing Crestor 5 mg because being requested too early.  03/04/2021 #90, 1 refill was given. ?

## 2021-06-26 ENCOUNTER — Telehealth: Payer: Self-pay | Admitting: Nurse Practitioner

## 2021-06-26 NOTE — Telephone Encounter (Signed)
Patient brought in a form that needs to be completed and faxed  by providerVernelle Emerald Annual Physical  Outside exam Request form) was placed in the providers folder for completion.

## 2021-07-06 NOTE — Telephone Encounter (Signed)
Spoke with patient regarding karen's message below. Pt voiced understanding.

## 2022-03-03 DIAGNOSIS — E7800 Pure hypercholesterolemia, unspecified: Secondary | ICD-10-CM | POA: Insufficient documentation

## 2022-03-03 NOTE — Progress Notes (Deleted)
There were no vitals taken for this visit.   Subjective:    Patient ID: Raymond Marsh, male    DOB: September 02, 1980, 42 y.o.   MRN: [id number]  HPI: Raymond Marsh is a 42 y.o. male presenting on 03/04/2022 for comprehensive medical examination. Current medical complaints include:{Blank single:19197::"none","***"}  He currently lives with: Interim Problems from his last visit: {Blank single:19197::"yes","no"}  Depression Screen done today and results listed below:     03/03/2021    8:39 AM  Depression screen PHQ 2/9  Decreased Interest 0  Down, Depressed, Hopeless 0  PHQ - 2 Score 0    The patient {has/does not have:19849} a history of falls. I {did/did not:19850} complete a risk assessment for falls. A plan of care for falls {was/was not:19852} documented.   Past Medical History:  Past Medical History:  Diagnosis Date   Rheumatoid arthritis ([place])     Surgical History:  Past Surgical History:  Procedure Laterality Date   CHOLECYSTECTOMY N/A 05/31/2019   Procedure: LAPAROSCOPIC CHOLECYSTECTOMY;  Surgeon: Fredirick Maudlin, MD;  Location: [facility] OR[facility];  Service: General;  Laterality: N/A;   UMBILICAL HERNIA REPAIR  05/31/2019   Procedure: HERNIA REPAIR UMBILICAL ADULT;  Surgeon: Fredirick Maudlin, MD;  Location: [facility];  Service: General;;    Medications:  Current Outpatient Medications on File Prior to Visit  Medication Sig   folic acid (FOLVITE) 1 MG tablet Take 1 mg by mouth daily.   ibuprofen (ADVIL) 800 MG tablet Take 800 mg by mouth every 6 (six) hours as needed.   methadone (DOLOPHINE) 10 MG/ML solution Take 110 mg by mouth daily.   methotrexate (RHEUMATREX) 2.5 MG tablet Take 15 mg by mouth once a week.   rosuvastatin (CRESTOR) 5 MG tablet Take 1 tablet (5 mg total) by mouth daily.   No current facility-administered medications on file prior to visit.    Allergies:  No Known Allergies  Social History:  Social History   Socioeconomic History   Marital status:  Married    Spouse name: Not on file   Number of children: Not on file   Years of education: Not on file   Highest education level: Not on file  Occupational History   Not on file  Tobacco Use   Smoking status: Every Day    Packs/day: 0.50    Types: Cigarettes   Smokeless tobacco: Never  Vaping Use   Vaping Use: Never used  Substance and Sexual Activity   Alcohol use: Not Currently   Drug use: Not Currently   Sexual activity: Yes  Other Topics Concern   Not on file  Social History Narrative   Not on file   Social Determinants of Health   Financial Resource Strain: Not on file  Food Insecurity: Not on file  Transportation Needs: Not on file  Physical Activity: Not on file  Stress: Not on file  Social Connections: Not on file  Intimate Partner Violence: Not on file   Social History   Tobacco Use  Smoking Status Every Day   Packs/day: 0.50   Types: Cigarettes  Smokeless Tobacco Never   Social History   Substance and Sexual Activity  Alcohol Use Not Currently    Family History:  Family History  Problem Relation Age of Onset   Autoimmune disease Mother    Diabetes Paternal Grandmother     Past medical history, surgical history, medications, allergies, family history and social history reviewed with patient today and changes made to appropriate areas of  the chart.   ROS All other ROS negative except what is listed above and in the HPI.      Objective:    There were no vitals taken for this visit.  Wt Readings from Last 3 Encounters:  03/03/21 237 lb 9.6 oz (107.8 kg)  12/06/20 230 lb (104.3 kg)  06/15/19 231 lb (104.8 kg)    Physical Exam  Results for orders placed or performed in visit on 03/03/21  Microscopic Examination   Urine  Result Value Ref Range   WBC, UA None seen 0 - 5 /hpf   RBC, Urine 0-2 0 - 2 /hpf   Epithelial Cells (non renal) None seen 0 - 10 /hpf   Mucus, UA Present (A) Not Estab.   Bacteria, UA None seen None seen/Few  TSH   Result Value Ref Range   TSH 2.990 0.450 - 4.500 uIU/mL  Lipid panel  Result Value Ref Range   Cholesterol, Total 238 (H) 100 - 199 mg/dL   Triglycerides 166 (H) 0 - 149 mg/dL   HDL 40 >39 mg/dL   VLDL Cholesterol Cal 31 5 - 40 mg/dL   LDL Chol Calc (NIH) 167 (H) 0 - 99 mg/dL   Chol/HDL Ratio 6.0 (H) 0.0 - 5.0 ratio  CBC with Differential/Platelet  Result Value Ref Range   WBC 7.0 3.4 - 10.8 x10E3/uL   RBC 4.80 4.14 - 5.80 x10E6/uL   Hemoglobin 13.4 13.0 - 17.7 g/dL   Hematocrit 40.4 37.5 - 51.0 %   MCV 84 79 - 97 fL   MCH 27.9 26.6 - 33.0 pg   MCHC 33.2 31.5 - 35.7 g/dL   RDW 16.0 (H) 11.6 - 15.4 %   Platelets 216 150 - 450 x10E3/uL   Neutrophils 58 Not Estab. %   Lymphs 30 Not Estab. %   Monocytes 8 Not Estab. %   Eos 3 Not Estab. %   Basos 1 Not Estab. %   Neutrophils Absolute 4.0 1.4 - 7.0 x10E3/uL   Lymphocytes Absolute 2.1 0.7 - 3.1 x10E3/uL   Monocytes Absolute 0.6 0.1 - 0.9 x10E3/uL   EOS (ABSOLUTE) 0.2 0.0 - 0.4 x10E3/uL   Basophils Absolute 0.1 0.0 - 0.2 x10E3/uL   Immature Granulocytes 0 Not Estab. %   Immature Grans (Abs) 0.0 0.0 - 0.1 x10E3/uL  Comprehensive metabolic panel  Result Value Ref Range   Glucose 96 70 - 99 mg/dL   BUN 11 6 - 24 mg/dL   Creatinine, Ser 1.14 0.76 - 1.27 mg/dL   eGFR 83 >59 mL/min/1.73   BUN/Creatinine Ratio 10 9 - 20   Sodium 139 134 - 144 mmol/L   Potassium 3.3 (L) 3.5 - 5.2 mmol/L   Chloride 99 96 - 106 mmol/L   CO2 26 20 - 29 mmol/L   Calcium 9.8 8.7 - 10.2 mg/dL   Total Protein 7.5 6.0 - 8.5 g/dL   Albumin 4.9 4.0 - 5.0 g/dL   Globulin, Total 2.6 1.5 - 4.5 g/dL   Albumin/Globulin Ratio 1.9 1.2 - 2.2   Bilirubin Total 0.5 0.0 - 1.2 mg/dL   Alkaline Phosphatase 77 44 - 121 IU/L   AST 33 0 - 40 IU/L   ALT 51 (H) 0 - 44 IU/L  Urinalysis, Routine w reflex microscopic  Result Value Ref Range   Specific Gravity, UA 1.020 1.005 - 1.030   pH, UA 6.5 5.0 - 7.5   Color, UA Yellow Yellow   Appearance Ur Clear Clear    Leukocytes,UA Negative  Negative   Protein,UA Trace (A) Negative/Trace   Glucose, UA Negative Negative   Ketones, UA Trace (A) Negative   RBC, UA Trace (A) Negative   Bilirubin, UA Negative Negative   Urobilinogen, Ur 0.2 0.2 - 1.0 mg/dL   Nitrite, UA Negative Negative   Microscopic Examination See below:   HgB A1c  Result Value Ref Range   Hgb A1c MFr Bld 5.7 (H) 4.8 - 5.6 %   Est. average glucose Bld gHb Est-mCnc 117 mg/dL      Assessment & Plan:   Problem List Items Addressed This Visit       Musculoskeletal and Integument   Rheumatoid arthritis ([place])   Other Visit Diagnoses     Annual physical exam    -  Primary        Discussed aspirin prophylaxis for myocardial infarction prevention and decision was {Blank single:19197::"it was not indicated","made to continue ASA","made to start ASA","made to stop ASA","that we recommended ASA, and patient refused"}  LABORATORY TESTING:  Health maintenance labs ordered today as discussed above.   The natural history of prostate cancer and ongoing controversy regarding screening and potential treatment outcomes of prostate cancer has been discussed with the patient. The meaning of a false positive PSA and a false negative PSA has been discussed. He indicates understanding of the limitations of this screening test and wishes *** to proceed with screening PSA testing.   IMMUNIZATIONS:   - Tdap: Tetanus vaccination status reviewed: {tetanus status:315746}. - Influenza: {Blank single:19197::"Up to date","Administered today","Postponed to flu season","Refused","Given elsewhere"} - Pneumovax: {Blank single:19197::"Up to date","Administered today","Not applicable","Refused","Given elsewhere"} - Prevnar: {Blank single:19197::"Up to date","Administered today","Not applicable","Refused","Given elsewhere"} - COVID: {Blank single:19197::"Up to date","Administered today","Not applicable","Refused","Given elsewhere"} - HPV: {Blank single:19197::"Up  to date","Administered today","Not applicable","Refused","Given elsewhere"} - Shingrix vaccine: {Blank single:19197::"Up to date","Administered today","Not applicable","Refused","Given elsewhere"}  SCREENING: - Colonoscopy: {Blank single:19197::"Up to date","Ordered today","Not applicable","Refused","Done elsewhere"}  Discussed with patient purpose of the colonoscopy is to detect colon cancer at curable precancerous or early stages   - AAA Screening: {Blank single:19197::"Up to date","Ordered today","Not applicable","Refused","Done elsewhere"}  -Hearing Test: {Blank single:19197::"Up to date","Ordered today","Not applicable","Refused","Done elsewhere"}  -Spirometry: {Blank single:19197::"Up to date","Ordered today","Not applicable","Refused","Done elsewhere"}   PATIENT COUNSELING:    Sexuality: Discussed sexually transmitted diseases, partner selection, use of condoms, avoidance of unintended pregnancy  and contraceptive alternatives.   Advised to avoid cigarette smoking.  I discussed with the patient that most people either abstain from alcohol or drink within safe limits (<=14/week and <=4 drinks/occasion for males, <=7/weeks and <= 3 drinks/occasion for females) and that the risk for alcohol disorders and other health effects rises proportionally with the number of drinks per week and how often a drinker exceeds daily limits.  Discussed cessation/primary prevention of drug use and availability of treatment for abuse.   Diet: Encouraged to adjust caloric intake to maintain  or achieve ideal body weight, to reduce intake of dietary saturated fat and total fat, to limit sodium intake by avoiding high sodium foods and not adding table salt, and to maintain adequate dietary potassium and calcium preferably from fresh fruits, vegetables, and low-fat dairy products.    stressed the importance of regular exercise  Injury prevention: Discussed safety belts, safety helmets, smoke detector, smoking  near bedding or upholstery.   Dental health: Discussed importance of regular tooth brushing, flossing, and dental visits.   Follow up plan: NEXT PREVENTATIVE PHYSICAL DUE IN 1 YEAR. No follow-ups on file.

## 2022-03-04 ENCOUNTER — Encounter: Payer: 59 | Admitting: Nurse Practitioner

## 2022-03-04 DIAGNOSIS — E7800 Pure hypercholesterolemia, unspecified: Secondary | ICD-10-CM

## 2022-03-04 DIAGNOSIS — M05761 Rheumatoid arthritis with rheumatoid factor of right knee without organ or systems involvement: Secondary | ICD-10-CM

## 2022-03-04 DIAGNOSIS — Z0000 Encounter for general adult medical examination without abnormal findings: Secondary | ICD-10-CM

## 2022-03-12 ENCOUNTER — Encounter: Payer: 59 | Admitting: Nurse Practitioner

## 2022-04-03 IMAGING — US US EXTREM LOW VENOUS*R*
1 series · 14 of 24 positions shown · non-contrast
Comparison: None.

CLINICAL DATA: Right leg swelling and pain x1 day.

EXAM:
Right LOWER EXTREMITY VENOUS DOPPLER ULTRASOUND
TECHNIQUE: Gray-scale sonography with compression, as well as color and duplex
ultrasound, were performed to evaluate the deep venous system(s)
from the level of the common femoral vein through the popliteal and
proximal calf veins.

[Series 1: us venous img lower uni right (dvt) · portal-venous · 14 of 35 slices shown]
[im 1/35]
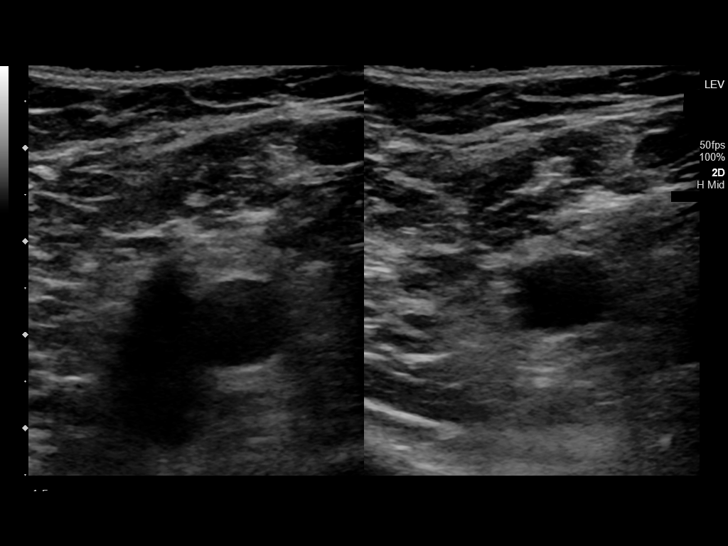
[im 3/35]
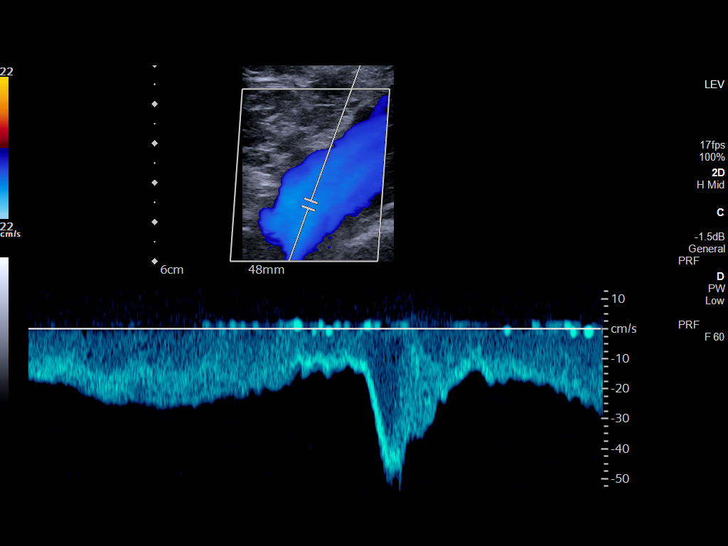
[im 6/35]
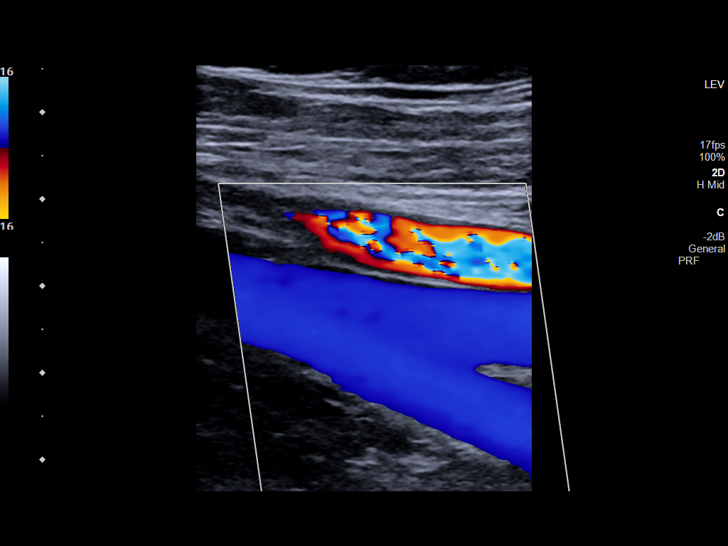
[im 9/35]
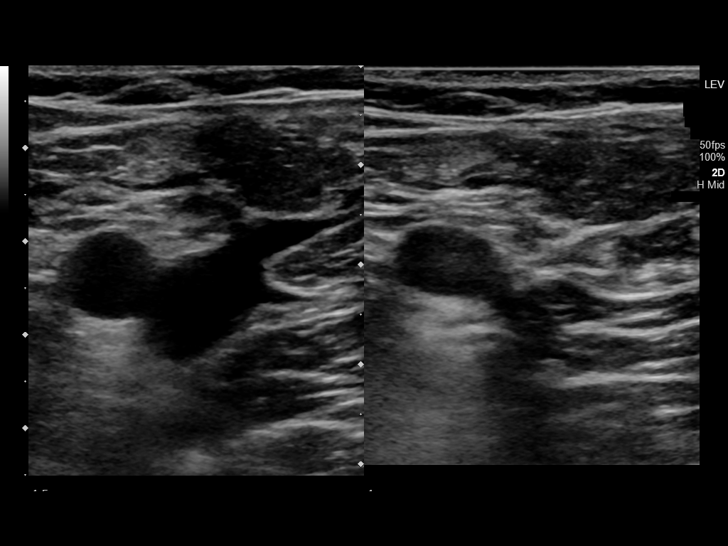
[im 11/35]
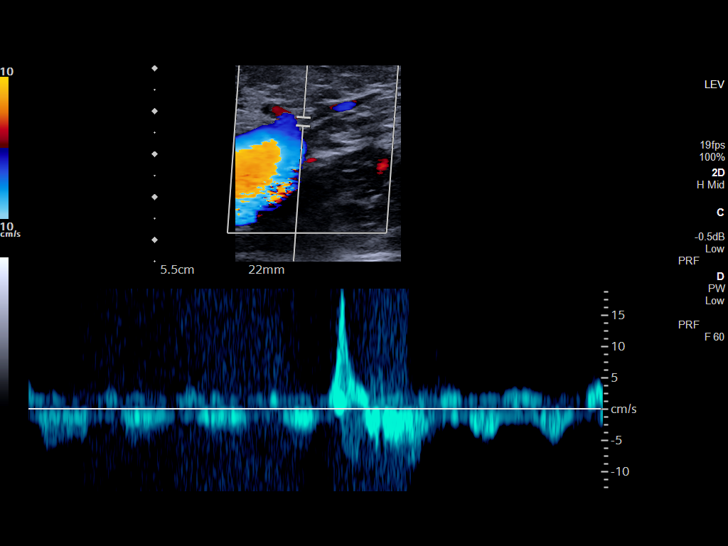
[im 14/35]
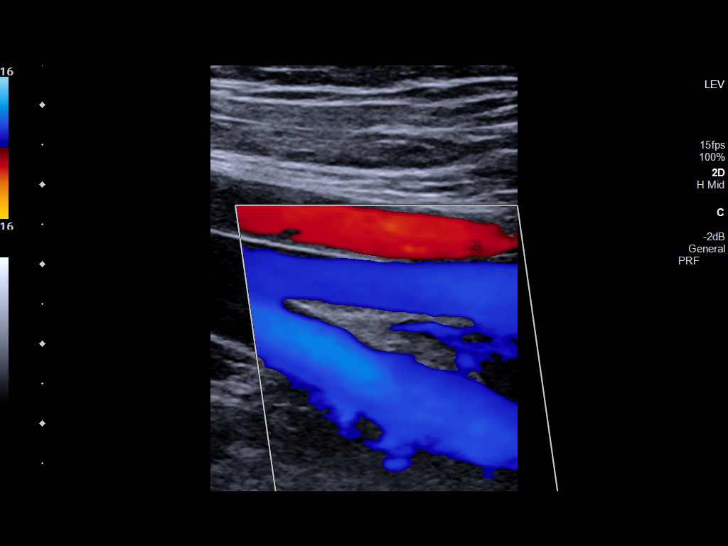
[im 17/35]
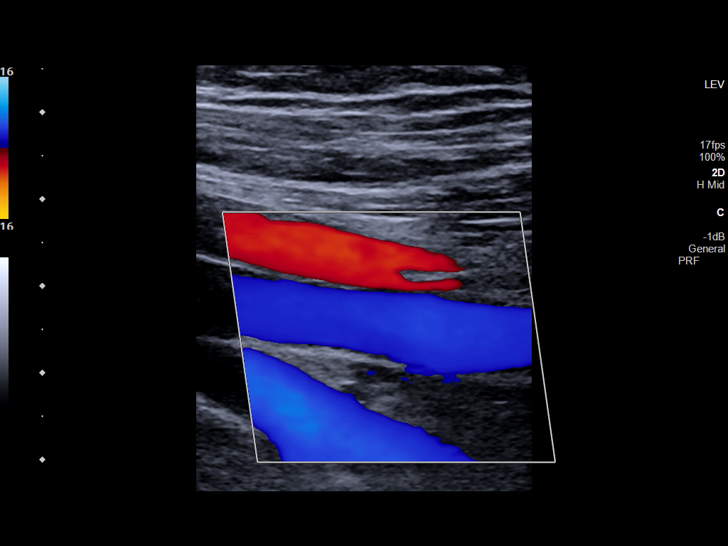
[im 18/35]
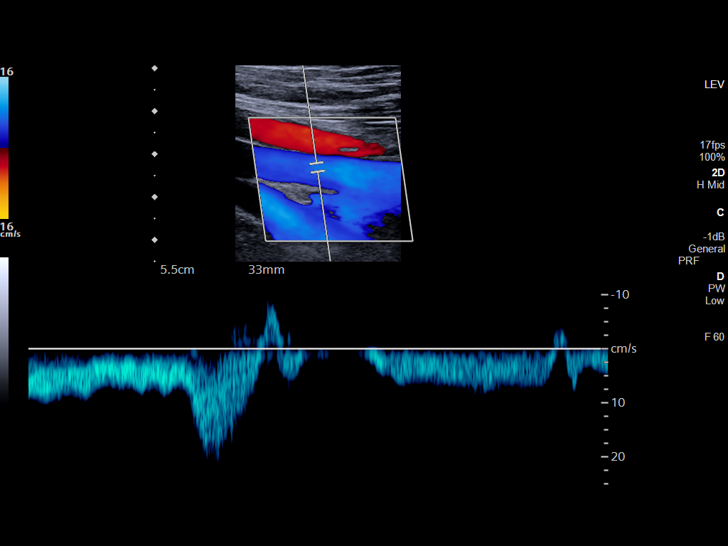
[im 21/35]
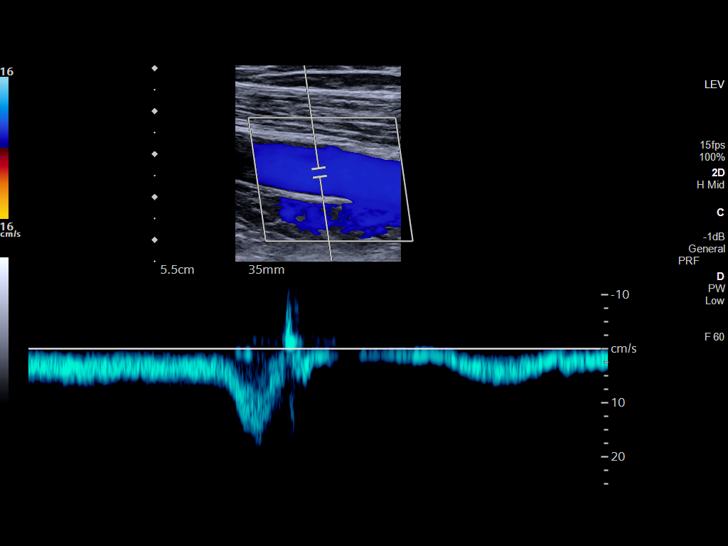
[im 24/35]
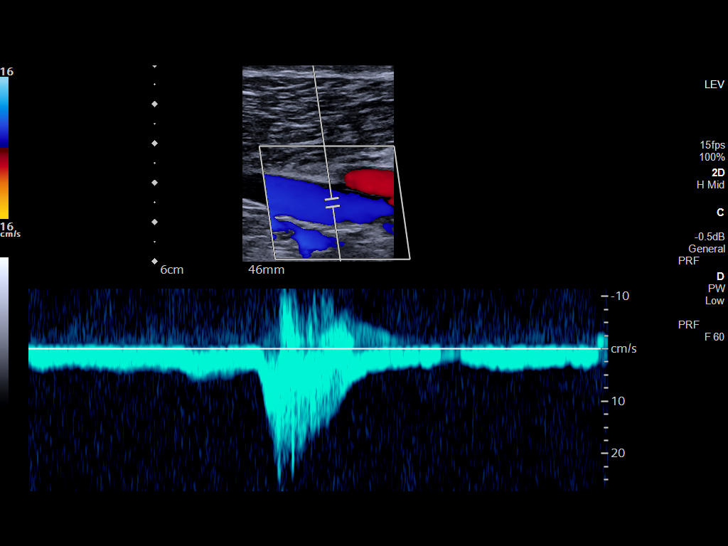
[im 27/35]
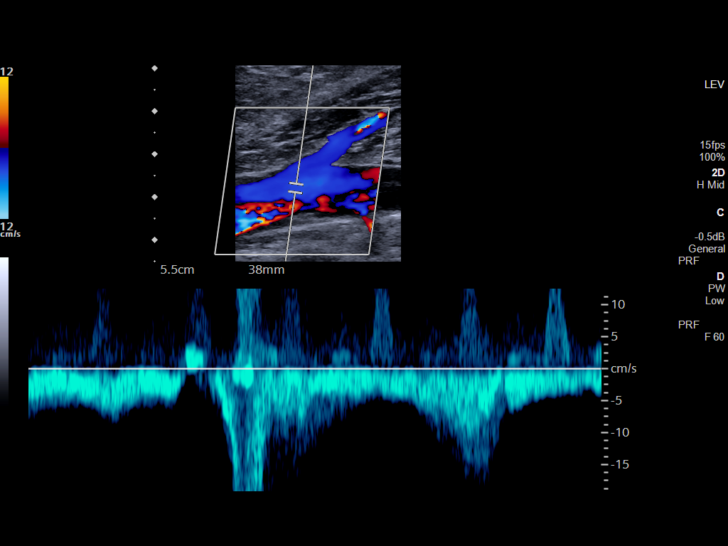
[im 29/35]
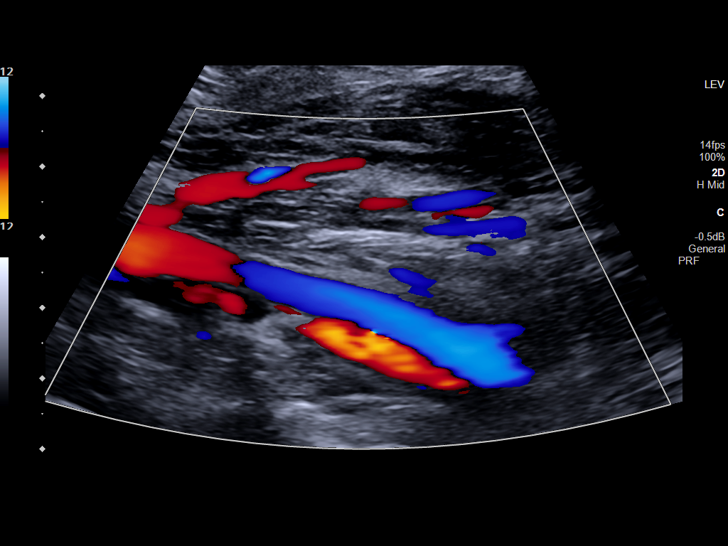
[im 32/35]
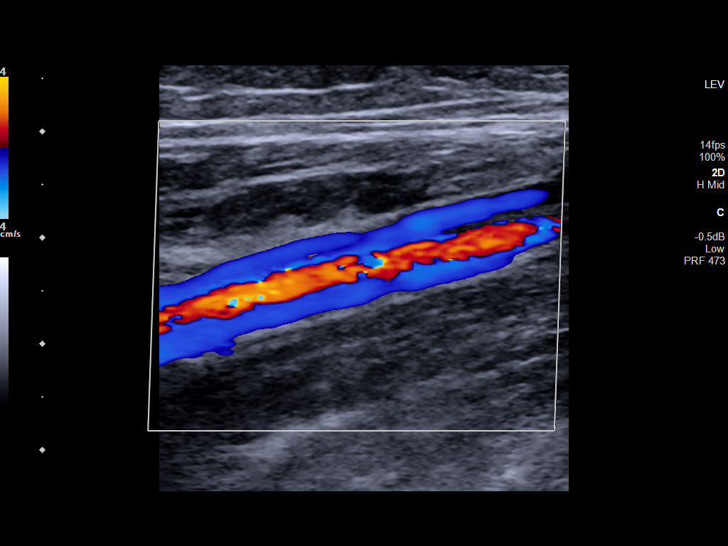
[im 35/35]
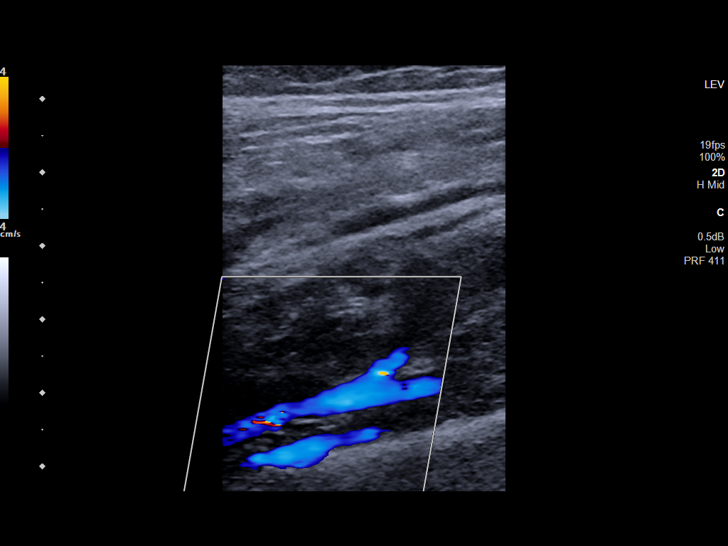

[14 of 24 positions shown; findings below may reference images not displayed]

FINDINGS: VENOUS

Normal compressibility of the common femoral, superficial femoral,
and popliteal veins, as well as the visualized calf veins.
Visualized portions of profunda femoral vein and great saphenous
vein unremarkable. No filling defects to suggest DVT on grayscale or
color Doppler imaging. Doppler waveforms show normal direction of
venous flow, normal respiratory plasticity and response to
augmentation.

Limited views of the contralateral common femoral vein are
unremarkable.

OTHER

None.

Limitations: none
IMPRESSION: Negative.
